# Patient Record
Sex: Male | Born: 1970 | Race: White | Hispanic: No | Marital: Married | Smoking: Current every day smoker
Health system: Southern US, Community
[De-identification: ages and names within clinical notes are randomized; demographics above are authoritative.]

## PROBLEM LIST (undated history)

## (undated) DIAGNOSIS — G894 Chronic pain syndrome: Secondary | ICD-10-CM

## (undated) DIAGNOSIS — F909 Attention-deficit hyperactivity disorder, unspecified type: Secondary | ICD-10-CM

## (undated) DIAGNOSIS — I2510 Atherosclerotic heart disease of native coronary artery without angina pectoris: Secondary | ICD-10-CM

## (undated) DIAGNOSIS — F32A Depression, unspecified: Secondary | ICD-10-CM

## (undated) DIAGNOSIS — R1115 Cyclical vomiting syndrome unrelated to migraine: Secondary | ICD-10-CM

## (undated) DIAGNOSIS — I255 Ischemic cardiomyopathy: Secondary | ICD-10-CM

## (undated) DIAGNOSIS — R0789 Other chest pain: Secondary | ICD-10-CM

## (undated) DIAGNOSIS — M47812 Spondylosis without myelopathy or radiculopathy, cervical region: Secondary | ICD-10-CM

## (undated) DIAGNOSIS — M47816 Spondylosis without myelopathy or radiculopathy, lumbar region: Secondary | ICD-10-CM

## (undated) DIAGNOSIS — F39 Unspecified mood [affective] disorder: Secondary | ICD-10-CM

## (undated) DIAGNOSIS — N529 Male erectile dysfunction, unspecified: Secondary | ICD-10-CM

## (undated) DIAGNOSIS — F419 Anxiety disorder, unspecified: Secondary | ICD-10-CM

## (undated) DIAGNOSIS — J439 Emphysema, unspecified: Secondary | ICD-10-CM

## (undated) DIAGNOSIS — F17200 Nicotine dependence, unspecified, uncomplicated: Secondary | ICD-10-CM

## (undated) DIAGNOSIS — J342 Deviated nasal septum: Secondary | ICD-10-CM

## (undated) HISTORY — DX: Spondylosis without myelopathy or radiculopathy, cervical region: M47.812

## (undated) HISTORY — PX: COLONOSCOPY: SHX174

## (undated) HISTORY — DX: Other chest pain: R07.89

## (undated) HISTORY — PX: HAND SURGERY: SHX662

## (undated) HISTORY — DX: Unspecified mood (affective) disorder: F39

## (undated) HISTORY — DX: Attention-deficit hyperactivity disorder, unspecified type: F90.9

## (undated) HISTORY — DX: Ischemic cardiomyopathy: I25.5

## (undated) HISTORY — DX: Emphysema, unspecified: J43.9

## (undated) HISTORY — DX: Deviated nasal septum: J34.2

## (undated) HISTORY — DX: Anxiety disorder, unspecified: F41.9

## (undated) HISTORY — DX: Male erectile dysfunction, unspecified: N52.9

## (undated) HISTORY — DX: Atherosclerotic heart disease of native coronary artery without angina pectoris: I25.10

## (undated) HISTORY — DX: Nicotine dependence, unspecified, uncomplicated: F17.200

## (undated) HISTORY — DX: Cyclical vomiting syndrome unrelated to migraine: R11.15

## (undated) HISTORY — PX: ESOPHAGOGASTRODUODENOSCOPY: SHX1529

## (undated) HISTORY — DX: Depression, unspecified: F32.A

## (undated) HISTORY — DX: Spondylosis without myelopathy or radiculopathy, lumbar region: M47.816

## (undated) HISTORY — DX: Chronic pain syndrome: G89.4

---

## 1998-09-29 ENCOUNTER — Emergency Department (HOSPITAL_COMMUNITY): Admission: EM | Admit: 1998-09-29 | Discharge: 1998-09-29 | Payer: Self-pay | Admitting: Internal Medicine

## 1999-01-26 ENCOUNTER — Emergency Department (HOSPITAL_COMMUNITY): Admission: EM | Admit: 1999-01-26 | Discharge: 1999-01-26 | Payer: Self-pay | Admitting: *Deleted

## 1999-02-24 ENCOUNTER — Emergency Department (HOSPITAL_COMMUNITY): Admission: EM | Admit: 1999-02-24 | Discharge: 1999-02-24 | Payer: Self-pay | Admitting: Emergency Medicine

## 1999-02-28 ENCOUNTER — Emergency Department (HOSPITAL_COMMUNITY): Admission: EM | Admit: 1999-02-28 | Discharge: 1999-02-28 | Payer: Self-pay | Admitting: Emergency Medicine

## 1999-03-03 ENCOUNTER — Emergency Department (HOSPITAL_COMMUNITY): Admission: EM | Admit: 1999-03-03 | Discharge: 1999-03-03 | Payer: Self-pay | Admitting: Internal Medicine

## 2000-02-13 ENCOUNTER — Emergency Department (HOSPITAL_COMMUNITY): Admission: EM | Admit: 2000-02-13 | Discharge: 2000-02-13 | Payer: Self-pay | Admitting: Emergency Medicine

## 2000-02-27 ENCOUNTER — Emergency Department (HOSPITAL_COMMUNITY): Admission: EM | Admit: 2000-02-27 | Discharge: 2000-02-27 | Payer: Self-pay | Admitting: Emergency Medicine

## 2000-11-20 ENCOUNTER — Emergency Department (HOSPITAL_COMMUNITY): Admission: EM | Admit: 2000-11-20 | Discharge: 2000-11-20 | Payer: Self-pay | Admitting: Emergency Medicine

## 2005-12-11 ENCOUNTER — Emergency Department (HOSPITAL_COMMUNITY): Admission: EM | Admit: 2005-12-11 | Discharge: 2005-12-11 | Payer: Self-pay | Admitting: Emergency Medicine

## 2005-12-20 ENCOUNTER — Emergency Department (HOSPITAL_COMMUNITY): Admission: EM | Admit: 2005-12-20 | Discharge: 2005-12-20 | Payer: Self-pay | Admitting: Emergency Medicine

## 2006-01-04 ENCOUNTER — Emergency Department (HOSPITAL_COMMUNITY): Admission: EM | Admit: 2006-01-04 | Discharge: 2006-01-04 | Payer: Self-pay | Admitting: Emergency Medicine

## 2006-01-05 ENCOUNTER — Emergency Department (HOSPITAL_COMMUNITY): Admission: EM | Admit: 2006-01-05 | Discharge: 2006-01-05 | Payer: Self-pay | Admitting: Emergency Medicine

## 2006-05-24 HISTORY — PX: ABDOMINAL SURGERY: SHX537

## 2007-09-26 ENCOUNTER — Emergency Department (HOSPITAL_COMMUNITY): Admission: EM | Admit: 2007-09-26 | Discharge: 2007-09-26 | Payer: Self-pay | Admitting: Emergency Medicine

## 2007-11-25 ENCOUNTER — Emergency Department (HOSPITAL_COMMUNITY): Admission: EM | Admit: 2007-11-25 | Discharge: 2007-11-25 | Payer: Self-pay | Admitting: Emergency Medicine

## 2008-01-29 ENCOUNTER — Emergency Department (HOSPITAL_BASED_OUTPATIENT_CLINIC_OR_DEPARTMENT_OTHER): Admission: EM | Admit: 2008-01-29 | Discharge: 2008-01-29 | Payer: Self-pay | Admitting: Emergency Medicine

## 2008-02-06 ENCOUNTER — Emergency Department (HOSPITAL_BASED_OUTPATIENT_CLINIC_OR_DEPARTMENT_OTHER): Admission: EM | Admit: 2008-02-06 | Discharge: 2008-02-06 | Payer: Self-pay | Admitting: Emergency Medicine

## 2008-02-09 ENCOUNTER — Emergency Department (HOSPITAL_BASED_OUTPATIENT_CLINIC_OR_DEPARTMENT_OTHER): Admission: EM | Admit: 2008-02-09 | Discharge: 2008-02-09 | Payer: Self-pay | Admitting: Emergency Medicine

## 2008-02-22 ENCOUNTER — Emergency Department (HOSPITAL_BASED_OUTPATIENT_CLINIC_OR_DEPARTMENT_OTHER): Admission: EM | Admit: 2008-02-22 | Discharge: 2008-02-22 | Payer: Self-pay | Admitting: Emergency Medicine

## 2008-10-17 ENCOUNTER — Emergency Department (HOSPITAL_COMMUNITY): Admission: EM | Admit: 2008-10-17 | Discharge: 2008-10-17 | Payer: Self-pay | Admitting: Emergency Medicine

## 2009-01-19 ENCOUNTER — Emergency Department (HOSPITAL_BASED_OUTPATIENT_CLINIC_OR_DEPARTMENT_OTHER): Admission: EM | Admit: 2009-01-19 | Discharge: 2009-01-19 | Payer: Self-pay | Admitting: Emergency Medicine

## 2009-03-17 ENCOUNTER — Emergency Department (HOSPITAL_BASED_OUTPATIENT_CLINIC_OR_DEPARTMENT_OTHER): Admission: EM | Admit: 2009-03-17 | Discharge: 2009-03-17 | Payer: Self-pay | Admitting: Emergency Medicine

## 2009-06-18 ENCOUNTER — Emergency Department (HOSPITAL_BASED_OUTPATIENT_CLINIC_OR_DEPARTMENT_OTHER): Admission: EM | Admit: 2009-06-18 | Discharge: 2009-06-18 | Payer: Self-pay | Admitting: Emergency Medicine

## 2009-08-27 ENCOUNTER — Telehealth (INDEPENDENT_AMBULATORY_CARE_PROVIDER_SITE_OTHER): Payer: Self-pay | Admitting: *Deleted

## 2009-08-27 ENCOUNTER — Encounter: Payer: Self-pay | Admitting: Family Medicine

## 2009-08-28 ENCOUNTER — Encounter: Payer: Self-pay | Admitting: Family Medicine

## 2009-09-01 ENCOUNTER — Ambulatory Visit: Payer: Self-pay | Admitting: Family Medicine

## 2009-09-01 DIAGNOSIS — F411 Generalized anxiety disorder: Secondary | ICD-10-CM | POA: Insufficient documentation

## 2009-09-01 DIAGNOSIS — F4001 Agoraphobia with panic disorder: Secondary | ICD-10-CM | POA: Insufficient documentation

## 2009-09-01 DIAGNOSIS — M5137 Other intervertebral disc degeneration, lumbosacral region: Secondary | ICD-10-CM | POA: Insufficient documentation

## 2009-09-02 ENCOUNTER — Telehealth: Payer: Self-pay | Admitting: Family Medicine

## 2010-06-23 NOTE — Progress Notes (Signed)
Summary: Reschedule apt.  Phone Note From Other Clinic   Summary of Call: Doc from [facility] stating Pt was DCd from hosp today and had to cancel apt w/ Dr. Cathey Endow. Doc requested [place] to call Pt to reschedule apt. I called Pt at # left on VM but I was unable to leave message for Pt bc mailbox was full.  Initial call taken by: Payton Spark CMA,  August 27, 2009 4:18 PM

## 2010-06-23 NOTE — Progress Notes (Signed)
Summary: Med refills?  Phone Note Call from Patient   Caller: Patient Summary of Call: Pt states his apt w/ Dr.Moore isn't until June. He would like to know if you will fill Adderall and Valium until he is able to see her. Please advise. Initial call taken by: Payton Spark CMA,  September 02, 2009 3:19 PM  Follow-up for Phone Call        no, I am not comfortable filling these RX's esp w/o old records. Follow-up by: Seymour Bars DO,  September 03, 2009 11:09 AM     Appended Document: Med refills? Pt aware

## 2010-06-23 NOTE — Assessment & Plan Note (Signed)
Summary: NOV anxiety/ back pain   Vital Signs:  Patient profile:   40 year old male Height:      67 inches Weight:      165 pounds BMI:     25.94 O2 Sat:      96 % on Room air Temp:     98.0 degrees F oral Pulse rate:   82 / minute BP sitting:   115 / 77  (left arm) Cuff size:   large  Vitals Entered By: Payton Spark CMA (40 11, 2011 10:49 AM)  O2 Flow:  Room air CC: New to est. Would like to restart Valium and Adderall. Is currently taking Percocet, promethazine and protonix for GI Sxs given by hosp.    Primary Care Provider:  Seymour Bars DO  CC:  New to est. Would like to restart Valium and Adderall. Is currently taking Percocet and promethazine and protonix for GI Sxs given by hosp. Marland Kitchen  History of Present Illness: 40 yo WM presents for NOV.  He was seen at the new [facility] last wk for gastroenteritis.  He is feeling better.  Stools are firming up and his nausea has improved.  He was sent home on Protonix and Phenergan.  He just moved back to the area from [place].  He has hx of GAD with agoraphobia, previously seen by psych.  On long term benzos and has been on Adderall for about a year for ADD dx.  He is on Percocet 3 x a day for LBP with hx of DDD.  Was seen by pain clinic in the past and did PT and LESIs but 'nothing else worked'.  He is not asking for RFs today.    Current Medications (verified): 1)  Valium 10 Mg Tabs (Diazepam) .... Take 1 Tab By Mouth Three Times A Day  As Needed Anxiety 2)  Adderall 20 Mg Tabs (Amphetamine-Dextroamphetamine) .... Take 1 Tab By Mouth Two Times A Day  Allergies (verified): 1)  ! Pcn 2)  ! Morphine 3)  ! Darvocet 4)  ! Keflex  Past History:  Past Medical History: GAD with agoraphobia ADD (?) Lumbar DDD  Past Surgical History: R hand surgery  Family History: father healthy sister anxiety mother died of breast cancer at 93  Social History: Disabled due to anxiety.  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  Kids live w/ their moms. Lives w/ fiance.  Smokes 1 ppd x 20 yrs. No regular exercise. Denies ellicit drug use  Review of Systems       no fevers/sweats/weakness, unexplained wt loss/gain, no change in vision, no difficulty hearing, ringing in ears, no hay fever/allergies, no CP/discomfort, no palpitations, no breast lump/nipple discharge, no cough/wheeze, no blood in stool,no  N/V/D, no nocturia, no leaking urine, no unusual vag bleeding, no vaginal/penile discharge, no muscle/joint pain, no rash, no new/changing mole, no HA, no memory loss, + anxiety, + sleep problem, no depression, no unexplained lumps, no easy bruising/bleeding, +concern with sexual function (trouble maintaining erection)   Physical Exam  General:  alert, well-developed, well-nourished, and well-hydrated.   Head:  normocephalic and atraumatic.   Eyes:  sclera non icteric Mouth:  pharynx pink and moist and poor dentition.   Neck:  no masses.   Lungs:  Normal respiratory effort, chest expands symmetrically. Lungs are clear to auscultation, no crackles or wheezes. Heart:  Normal rate and regular rhythm. S1 and S2 normal without gallop, murmur, click, rub or other extra  sounds. Extremities:  no LE edema R wrist in cast Skin:  color normal.  multiple tattoes dirty fingernails Psych:  flat affect.     Impression & Recommendations:  Problem # 1:  ANXIETY DISORDER (ICD-300.00) Will refer him to Dr Christell Constant to manage his anxiety, agoraphobia and ADD.  Heavy benzo use by history and he is not on an SSRI or mood stabilizer.  His updated medication list for this problem includes:    Valium 10 Mg Tabs (Diazepam) .Marland Kitchen... 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  No new RXs given today. Orders: Pain  Clinic Referral (Pain)  Problem # 3:  GASTROENTERITIS (ICD-558.9) Assessment: Improved  Complete Medication List: 1)  Valium 10 Mg Tabs (Diazepam) .... Take 1 tab by mouth three times a day  as needed anxiety 2)  Adderall 20 Mg Tabs (Amphetamine-dextroamphetamine) .... Take 1 tab by mouth two times a day  Patient Instructions: 1)  Referrals made for the pain clinic and psychiatrist. 2)  Use Cialis samples as needed. 3)  Return in 3 months to update fasting labs.

## 2010-06-23 NOTE — Medication Information (Signed)
Summary: Med Refill List/[facility]  Med Refill List/[facility]   Imported By: Lanelle Bal 09/06/2009 11:47:57  _____________________________________________________________________  External Attachment:    Type:   Image     Comment:   External Document

## 2010-06-23 NOTE — Letter (Signed)
Summary: [facility]  [facility]   Imported By: Lanelle Bal 09/09/2009 12:16:24  _____________________________________________________________________  External Attachment:    Type:   Image     Comment:   External Document

## 2011-11-18 ENCOUNTER — Ambulatory Visit (INDEPENDENT_AMBULATORY_CARE_PROVIDER_SITE_OTHER)
Admission: RE | Admit: 2011-11-18 | Discharge: 2011-11-18 | Disposition: A | Discharge status: Home / Self Care | Payer: Medicare Other | Source: Ambulatory Visit | Attending: Family Medicine | Admitting: Family Medicine

## 2011-11-18 ENCOUNTER — Ambulatory Visit (INDEPENDENT_AMBULATORY_CARE_PROVIDER_SITE_OTHER): Payer: Medicare Other | Admitting: Family Medicine

## 2011-11-18 ENCOUNTER — Other Ambulatory Visit: Payer: Self-pay

## 2011-11-18 ENCOUNTER — Other Ambulatory Visit: Payer: Self-pay | Admitting: Family Medicine

## 2011-11-18 ENCOUNTER — Encounter: Payer: Self-pay | Admitting: Family Medicine

## 2011-11-18 VITALS — BP 131/90 | HR 63 | Temp 96.7°F | Ht 67.0 in | Wt 180.0 lb

## 2011-11-18 DIAGNOSIS — K219 Gastro-esophageal reflux disease without esophagitis: Secondary | ICD-10-CM | POA: Insufficient documentation

## 2011-11-18 DIAGNOSIS — R7402 Elevation of levels of lactic acid dehydrogenase (LDH): Secondary | ICD-10-CM

## 2011-11-18 DIAGNOSIS — Z609 Problem related to social environment, unspecified: Secondary | ICD-10-CM | POA: Insufficient documentation

## 2011-11-18 DIAGNOSIS — R079 Chest pain, unspecified: Secondary | ICD-10-CM

## 2011-11-18 DIAGNOSIS — Z7289 Other problems related to lifestyle: Secondary | ICD-10-CM

## 2011-11-18 DIAGNOSIS — Z7251 High risk heterosexual behavior: Secondary | ICD-10-CM

## 2011-11-18 DIAGNOSIS — IMO0001 Reserved for inherently not codable concepts without codable children: Secondary | ICD-10-CM

## 2011-11-18 DIAGNOSIS — R7401 Elevation of levels of liver transaminase levels: Secondary | ICD-10-CM

## 2011-11-18 LAB — CBC WITH DIFFERENTIAL/PLATELET
Basophils Relative: 0.4 % (ref 0.0–3.0)
Eosinophils Absolute: 0.1 10*3/uL (ref 0.0–0.7)
HCT: 44.1 % (ref 39.0–52.0)
Lymphs Abs: 2.3 10*3/uL (ref 0.7–4.0)
MCHC: 33.2 g/dL (ref 30.0–36.0)
Neutro Abs: 4.8 10*3/uL (ref 1.4–7.7)
Platelets: 267 10*3/uL (ref 150.0–400.0)
RBC: 4.7 Mil/uL (ref 4.22–5.81)
RDW: 13.8 % (ref 11.5–14.6)

## 2011-11-18 LAB — COMPREHENSIVE METABOLIC PANEL
ALT: 18 U/L (ref 0–53)
AST: 17 U/L (ref 0–37)
Albumin: 4 g/dL (ref 3.5–5.2)
Alkaline Phosphatase: 80 U/L (ref 39–117)
BUN: 10 mg/dL (ref 6–23)
CO2: 31 mEq/L (ref 19–32)
Calcium: 9.3 mg/dL (ref 8.4–10.5)
Chloride: 105 mEq/L (ref 96–112)
Potassium: 4.2 mEq/L (ref 3.5–5.1)
Total Bilirubin: 0.4 mg/dL (ref 0.3–1.2)

## 2011-11-18 LAB — D-DIMER, QUANTITATIVE: D-Dimer, Quant: 0.35 ug/mL-FEU (ref 0.00–0.48)
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
## 2011-11-18 NOTE — Assessment & Plan Note (Signed)
History of incarceration, question of pain med seeking in the past, failure to comply with recommended referrals to pain mgmt and psychiatry. Will have to take things slowly, work on one problem at a time, see that he is going to be compliant. I did not start and psych rx's or pain meds today--we did not even get into a discussion about management of these problems yet. I will f/u his reported hx of elevated liver enzymes while in prison (he says all hepatitis testing was neg): check CMET, Hep B sAg, Hep B surface antibody, anti-hep C antibody.

## 2011-11-18 NOTE — Patient Instructions (Signed)
Take one Dexilant 60mg  cap once every morning before breakfast.  Diet for GERD or PUD Nutrition therapy can help ease the discomfort of gastroesophageal reflux disease (GERD) and peptic ulcer disease (PUD).  HOME CARE INSTRUCTIONS   Eat your meals slowly, in a relaxed setting.   Eat 5 to 6 small meals per day.   If a food causes distress, stop eating it for a period of time.  FOODS TO AVOID  Coffee, regular or decaffeinated.   Cola beverages, regular or low calorie.   Tea, regular or decaffeinated.   Pepper.   Cocoa.   High fat foods, including meats.   Butter, margarine, hydrogenated oil (trans fats).   Peppermint or spearmint (if you have GERD).   Fruits and vegetables if not tolerated.   Alcohol.   Nicotine (smoking or chewing). This is one of the most potent stimulants to acid production in the gastrointestinal tract.   Any food that seems to aggravate your condition.  If you have questions regarding your diet, ask your caregiver or a registered dietitian. TIPS  Lying flat may make symptoms worse. Keep the head of your bed raised 6 to 9 inches (15 to 23 cm) by using a foam wedge or blocks under the legs of the bed.   Do not lay down until 3 hours after eating a meal.   Daily physical activity may help reduce symptoms.  MAKE SURE YOU:   Understand these instructions.   Will watch your condition.   Will get help right away if you are not doing well or get worse.  Document Released: 05/10/2005 Document Revised: 04/29/2011 Document Reviewed: 03/26/2011 [facility] Patient Information 2012 [place], [place].

## 2011-11-18 NOTE — Assessment & Plan Note (Addendum)
Atypical chest pain, not suggestive of cardiac ischemia. Will check d-dimer and if pos will proceed with chest CT to r/o PE.  Will check CXR here today. When he returns in 2 wks we'll do TB skin test. Will treat for possible GERD/Esophagitis: samples of dexilant 60mg  qd given.  Therapeutic expectations and side effect profile of medication discussed today.  Patient's questions answered. This chest pain could also be musculoskeletal given it's reproduceability with left chest wall palpation.

## 2011-11-23 ENCOUNTER — Other Ambulatory Visit: Payer: Medicare Other

## 2011-12-02 ENCOUNTER — Encounter: Payer: Self-pay | Admitting: Family Medicine

## 2011-12-02 ENCOUNTER — Ambulatory Visit (INDEPENDENT_AMBULATORY_CARE_PROVIDER_SITE_OTHER): Payer: Medicare Other | Admitting: Family Medicine

## 2011-12-02 VITALS — BP 138/85 | HR 68 | Ht 67.0 in | Wt 182.0 lb

## 2011-12-02 DIAGNOSIS — M51379 Other intervertebral disc degeneration, lumbosacral region without mention of lumbar back pain or lower extremity pain: Secondary | ICD-10-CM | POA: Insufficient documentation

## 2011-12-02 DIAGNOSIS — M5137 Other intervertebral disc degeneration, lumbosacral region: Secondary | ICD-10-CM | POA: Insufficient documentation

## 2011-12-02 DIAGNOSIS — Z23 Encounter for immunization: Secondary | ICD-10-CM

## 2011-12-02 DIAGNOSIS — K219 Gastro-esophageal reflux disease without esophagitis: Secondary | ICD-10-CM

## 2011-12-02 DIAGNOSIS — M5030 Other cervical disc degeneration, unspecified cervical region: Secondary | ICD-10-CM | POA: Insufficient documentation

## 2011-12-02 MED ORDER — OXYCODONE-ACETAMINOPHEN 5-325 MG PO TABS: ORAL_TABLET | ORAL | Status: DC

## 2011-12-02 NOTE — Assessment & Plan Note (Signed)
No radiculopathy. It has been 4 years since radiographic f/u per his report so I'll do plain film of L/S spine and will likely refer to neurosurgeon for further evaluation. I did give #120 Percocet 5/325 to take 1-2 q6h prn pan, no RF.  I reiterated that if this ends up being a chronic pain med treatment issue that I will refer him to a pain mgmt clinic.

## 2011-12-02 NOTE — Assessment & Plan Note (Signed)
No radiculopathy. Will do plain c-spine films today. See problem "DDD, lumbosacral" for further info.
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
## 2011-12-02 NOTE — Assessment & Plan Note (Signed)
Atypical CP had been his presentation. This is resolved on daily PPI (dexilant samples).  I recommended he take this daily for at least 56mo, then take on prn basis.

## 2011-12-21 ENCOUNTER — Telehealth: Payer: Self-pay | Admitting: *Deleted

## 2011-12-21 NOTE — Telephone Encounter (Signed)
VM left by patient requesting call back.  Attempt to call patient-automated message from phone company stating customer not available.  I will try again later.

## 2011-12-22 NOTE — Telephone Encounter (Signed)
Attempt to call patient-automated message from phone company stating customer not available. I will try again later.

## 2011-12-24 NOTE — Telephone Encounter (Signed)
I spoke to pt and he states he thinks his phone was turned off.  Pt was having "bad anxiety" on Wednesday and wanted to know if anyhting could be called in.  Advised if we have not talked to him about this at a prior visit then he would have to be seen first.  Pt has appt on Friday.  He says he can wait until then.  Sooner appt was offered, but patient declined.  He will have xrays prior to appt on Friday.

## 2011-12-28 ENCOUNTER — Ambulatory Visit (HOSPITAL_BASED_OUTPATIENT_CLINIC_OR_DEPARTMENT_OTHER)
Admission: RE | Admit: 2011-12-28 | Discharge: 2011-12-28 | Disposition: A | Discharge status: Home / Self Care | Payer: Medicare Other | Source: Ambulatory Visit | Attending: Family Medicine | Admitting: Family Medicine

## 2011-12-28 DIAGNOSIS — M542 Cervicalgia: Secondary | ICD-10-CM | POA: Insufficient documentation

## 2011-12-28 DIAGNOSIS — M545 Low back pain, unspecified: Secondary | ICD-10-CM | POA: Insufficient documentation

## 2011-12-28 DIAGNOSIS — M5030 Other cervical disc degeneration, unspecified cervical region: Secondary | ICD-10-CM | POA: Insufficient documentation

## 2011-12-28 DIAGNOSIS — M5137 Other intervertebral disc degeneration, lumbosacral region: Secondary | ICD-10-CM

## 2011-12-28 DIAGNOSIS — M47812 Spondylosis without myelopathy or radiculopathy, cervical region: Secondary | ICD-10-CM | POA: Insufficient documentation

## 2011-12-30 ENCOUNTER — Encounter: Payer: Self-pay | Admitting: Family Medicine

## 2011-12-31 ENCOUNTER — Encounter: Payer: Self-pay | Admitting: Family Medicine

## 2011-12-31 ENCOUNTER — Ambulatory Visit (INDEPENDENT_AMBULATORY_CARE_PROVIDER_SITE_OTHER): Payer: Medicare Other | Admitting: Family Medicine

## 2011-12-31 VITALS — BP 157/92 | HR 79 | Temp 97.2°F | Ht 67.0 in | Wt 173.4 lb

## 2011-12-31 DIAGNOSIS — M545 Low back pain, unspecified: Secondary | ICD-10-CM

## 2011-12-31 DIAGNOSIS — F411 Generalized anxiety disorder: Secondary | ICD-10-CM

## 2011-12-31 DIAGNOSIS — M47812 Spondylosis without myelopathy or radiculopathy, cervical region: Secondary | ICD-10-CM

## 2011-12-31 DIAGNOSIS — Z7289 Other problems related to lifestyle: Secondary | ICD-10-CM

## 2011-12-31 DIAGNOSIS — Z609 Problem related to social environment, unspecified: Secondary | ICD-10-CM

## 2011-12-31 DIAGNOSIS — IMO0001 Reserved for inherently not codable concepts without codable children: Secondary | ICD-10-CM

## 2011-12-31 DIAGNOSIS — G894 Chronic pain syndrome: Secondary | ICD-10-CM

## 2011-12-31 DIAGNOSIS — G8929 Other chronic pain: Secondary | ICD-10-CM

## 2011-12-31 DIAGNOSIS — K219 Gastro-esophageal reflux disease without esophagitis: Secondary | ICD-10-CM

## 2011-12-31 MED ORDER — PANTOPRAZOLE SODIUM 40 MG PO TBEC: 40.0000 mg | DELAYED_RELEASE_TABLET | Freq: Every day | ORAL | Status: DC

## 2011-12-31 MED ORDER — OXYCODONE-ACETAMINOPHEN 5-325 MG PO TABS: ORAL_TABLET | ORAL | Status: DC

## 2011-12-31 NOTE — Progress Notes (Signed)
OFFICE NOTE  12/31/2011  CC:  Chief Complaint  Patient presents with  . Follow-up     HPI: Patient is a 41 y.o. Caucasian male who is here for 1 month f/u chronic neck and low back pain without radicular symptoms. His L-spine film was unremarkable and his C-spine x-ray was significant for significant DDD/spondylosis. Has been using the pain meds and has a few left from the 120 I gave him a month ago.  These help him to be functional, get out of house, etc. His pain remains localized to lower lumbar region and cervical spine region but no radiation of pain, no paresthesias, no weakness.   Dexilant has helped GERD, he is running out of samples.  Pertinent PMH:  Past Medical History  Diagnosis Date  . Anxiety     Panic with agoraphobia  . Cervical spondylosis     Plain film 11/2011  . Chronic pain syndrome     low  back and neck  . Mood disorder     bipolar vs depression  . ADHD (attention deficit hyperactivity disorder)   . Tobacco dependence   . Deviated nasal septum     s/p nasal fracture  . Atypical chest pain     CXR normal here 10/2011, D-dimer negative.  Resolved with daily use of dexilant.   Past surgical, social, and family history reviewed and no changes noted since last office visit.  MEDS:  Outpatient Prescriptions Prior to Visit  Medication Sig Dispense Refill  . dexlansoprazole (DEXILANT) 60 MG capsule Take 60 mg by mouth daily.      Marland Kitchen oxyCODONE-acetaminophen (ROXICET) 5-325 MG per tablet 1-2 tabs po q6h prn  120 tablet  0    PE: Blood pressure 157/92, pulse 79, temperature 97.2 F (36.2 C), temperature source Temporal, height 5\' 7"  (1.702 m), weight 173 lb 6.4 oz (78.654 kg), SpO2 97.00%. Gen: Alert, well appearing.  Patient is oriented to person, place, time, and situation. AFFECT: pleasant, calm.  Lucid thought and speech.  IMPRESSION AND PLAN:  ANXIETY DISORDER I will refer him to interventional pain mgmt. I did RF his current pain med (percocet,  #120) today.   GERD (gastroesophageal reflux disease) Problem stable.  Continue current medications and diet appropriate for this condition.  We have reviewed our general long term plan for this problem and also reviewed symptoms and signs that should prompt the patient to call or return to the office. I gave more samples of dexilant today. Also sent rx for pantoprazole to pharmacy for him to start when his dexilant samples run out.  High risk social situations Hx of incarceration.  Multiple tattoos, will likely get more in future. TB skin test done today. When he follows up to get the skin test read, we'll give Hep B #2.   FOLLOW UP: 69mo

## 2011-12-31 NOTE — Assessment & Plan Note (Signed)
Problem stable.  Continue current medications and diet appropriate for this condition.  We have reviewed our general long term plan for this problem and also reviewed symptoms and signs that should prompt the patient to call or return to the office. I gave more samples of dexilant today. Also sent rx for pantoprazole to pharmacy for him to start when his dexilant samples run out.

## 2011-12-31 NOTE — Assessment & Plan Note (Signed)
Hx of incarceration.  Multiple tattoos, will likely get more in future. TB skin test done today. When he follows up to get the skin test read, we'll give Hep B #2.

## 2011-12-31 NOTE — Assessment & Plan Note (Signed)
I will refer him to interventional pain mgmt. I did RF his current pain med (percocet, #120) today.

## 2012-01-03 ENCOUNTER — Other Ambulatory Visit: Payer: Self-pay | Admitting: *Deleted

## 2012-01-03 ENCOUNTER — Ambulatory Visit: Payer: Medicare Other

## 2012-01-03 DIAGNOSIS — Z23 Encounter for immunization: Secondary | ICD-10-CM

## 2012-01-03 LAB — TB SKIN TEST: TB Skin Test: NEGATIVE

## 2012-01-03 NOTE — Addendum Note (Signed)
Addended byDerry Skill on: 01/03/2012 04:21 PM   Modules accepted: Orders

## 2012-02-02 ENCOUNTER — Ambulatory Visit (INDEPENDENT_AMBULATORY_CARE_PROVIDER_SITE_OTHER): Payer: Medicare Other | Admitting: Family Medicine

## 2012-02-02 ENCOUNTER — Encounter: Payer: Self-pay | Admitting: Family Medicine

## 2012-02-02 VITALS — BP 151/81 | HR 78 | Ht 67.0 in | Wt 171.0 lb

## 2012-02-02 DIAGNOSIS — G894 Chronic pain syndrome: Secondary | ICD-10-CM

## 2012-02-02 DIAGNOSIS — N529 Male erectile dysfunction, unspecified: Secondary | ICD-10-CM

## 2012-02-02 MED ORDER — OXYCODONE-ACETAMINOPHEN 5-325 MG PO TABS: ORAL_TABLET | ORAL | Status: DC

## 2012-02-02 NOTE — Assessment & Plan Note (Signed)
Longstanding (going on prior to chronic narcotic pain med use).  Libido is intact.   Pt says cialis prn has helped in the past.   We have samples of cialis 5mg  for daily use, #1 pack of 30.  I gave him these today.

## 2012-02-02 NOTE — Addendum Note (Signed)
Addended by: Baldemar Lenis R on: 02/02/2012 01:45 PM   Modules accepted: Orders

## 2012-02-02 NOTE — Assessment & Plan Note (Signed)
Neck and LB DDD. Plan discussed.  See HPI. I gave #60 of the roxicet 5/325 for him to use as sparingly as possible. UDS today.  He says his last dose of roxicet was this morning. Since he is moving, we can RF pain meds like this q 2 wks as long as he is not showing signs of overuse or diversion.

## 2012-02-02 NOTE — Progress Notes (Signed)
OFFICE NOTE  02/02/2012  CC:  Chief Complaint  Patient presents with  . Follow-up    medication refills;      HPI: Patient is a 41 y.o. Caucasian male who is here for f/u chronic pain.  I saw him 1 mo ago and referred him to pain mgmt clinic but he did not show up for that appointment.  He states he is moving back to [place] 02/2012 (towards the end of the month).  He asks for RF of pain meds. Says he missed the pain mgmt appt due to lack of transportation.  Did not cancel the appt due to some sort of phone number mix-up (unclear).    He c/o ED for many years, even prior to being on narcotic pain meds.  Libido is intact. Cialis prn dosing has helped in the past.  He says viagra worked but caused bothersome flushing.   Pertinent PMH:  Past Medical History  Diagnosis Date  . Anxiety     Panic with agoraphobia  . Cervical spondylosis     Plain film 11/2011  . Chronic pain syndrome     low  back and neck  . Mood disorder     bipolar vs depression  . ADHD (attention deficit hyperactivity disorder)   . Tobacco dependence   . Deviated nasal septum     s/p nasal fracture  . Atypical chest pain     CXR normal here 10/2011, D-dimer negative.  Resolved with daily use of dexilant.    MEDS:  Outpatient Prescriptions Prior to Visit  Medication Sig Dispense Refill  . dexlansoprazole (DEXILANT) 60 MG capsule Take 60 mg by mouth daily.      Marland Kitchen oxyCODONE-acetaminophen (ROXICET) 5-325 MG per tablet 1-2 tabs po q6h prn  120 tablet  0  . pantoprazole (PROTONIX) 40 MG tablet Take 1 tablet (40 mg total) by mouth daily.  30 tablet  3    PE: Blood pressure 151/81, pulse 78, height 5\' 7"  (1.702 m), weight 171 lb (77.565 kg). Gen: Alert, well appearing.  Patient is oriented to person, place, time, and situation. AFFECT: pleasant, lucid thought and speech. No further exam today.  IMPRESSION AND PLAN:  Chronic pain syndrome Neck and LB DDD. Plan discussed.  See HPI. I gave #60 of the roxicet  5/325 for him to use as sparingly as possible. UDS today.  He says his last dose of roxicet was this morning. Since he is moving, we can RF pain meds like this q 2 wks as long as he is not showing signs of overuse or diversion.  Erectile dysfunction Longstanding (going on prior to chronic narcotic pain med use).  Libido is intact.   Pt says cialis prn has helped in the past.   We have samples of cialis 5mg  for daily use, #1 pack of 30.  I gave him these today.   He declined flu vaccine today.  FOLLOW UP: prn

## 2012-02-03 LAB — DRUG SCREEN, URINE
Amphetamine Screen, Ur: NEGATIVE
Cocaine Metabolites: NEGATIVE
Creatinine,U: 158.98 mg/dL
Marijuana Metabolite: NEGATIVE
Opiates: NEGATIVE

## 2012-02-07 NOTE — Progress Notes (Signed)
Quick Note:  Notified pt on phone today at 8:15 AM that his UDS showed NO NARCOTICS. He said he took narcotic pain med just a few hours prior to this UDS. I told him that I will no longer prescribe any controlled substances to him. He expressed understanding of this decision.-PM ______

## 2012-02-10 ENCOUNTER — Telehealth: Payer: Self-pay | Admitting: Family Medicine

## 2012-02-10 NOTE — Telephone Encounter (Signed)
LMOM today at 6 pm asking pt to return my call.  I want to discuss the fact that his recent UDS was indeed negative for opiates (morphine and codeine) but should have also tested for opioids (hydrocodone, oxycodone, hydromorphone).   I will offer redo of the appropriate UDS to confirm that he is taking his pain meds IF he has not already run out of the most recent rx I gave him.

## 2012-02-10 NOTE — Telephone Encounter (Signed)
Pt called back and i notified him that the incorrect UDS was done last time he was in office. I recommended he return for the appropriate UDS (to check for opioids) BEFORE he runs out of current pain med.  He said he would do this.

## 2012-02-18 ENCOUNTER — Ambulatory Visit (INDEPENDENT_AMBULATORY_CARE_PROVIDER_SITE_OTHER): Payer: Medicare Other | Admitting: Family Medicine

## 2012-02-18 ENCOUNTER — Encounter: Payer: Self-pay | Admitting: Family Medicine

## 2012-02-18 ENCOUNTER — Other Ambulatory Visit: Payer: Self-pay | Admitting: Family Medicine

## 2012-02-18 VITALS — BP 157/82 | HR 74 | Ht 67.0 in | Wt 172.0 lb

## 2012-02-18 DIAGNOSIS — G894 Chronic pain syndrome: Secondary | ICD-10-CM

## 2012-02-18 DIAGNOSIS — M545 Low back pain, unspecified: Secondary | ICD-10-CM

## 2012-02-18 DIAGNOSIS — G8929 Other chronic pain: Secondary | ICD-10-CM

## 2012-02-18 MED ORDER — OXYCODONE-ACETAMINOPHEN 5-325 MG PO TABS: ORAL_TABLET | ORAL | Status: DC

## 2012-02-18 NOTE — Progress Notes (Signed)
OFFICE NOTE  02/20/2012  CC:  Chief Complaint  Patient presents with  . Follow-up    pain     HPI: Patient is a 41 y.o. Caucasian male who is here for f/u chronic pain syndrome.  Hx of chronic neck and low back pain.  Intensity of low back pain 8/10 off meds, comes down to 4/10 with oxycodone/apap.  He says he averages 4 per day but some days lately he has taken more due to tooth for a broken tooth; has appt to get this out next week.  Took his last 2 pain pills yesterday.  Neck pain is less of a problem--occ some radiation around side of neck to top of shoulder. He has no radiation of his low back pain.  He says he has had this severe back pain for at least 10 yrs.  Last visit I did a UDS on him and it came back negative for opiates.  The screen did not test for OPIOIDS, which is what oxycodone would show up as.  I initially had told him he failed his UDS and that I could not prescribe any controlled substances to him b/c it appears he may be diverting the medication.  However, I called him back and told him I did not do the correct UDS and told him to come back and get the correct UDS.  He does say he had a UDS by his parole officer and it did show opioids so he was able to get his leg monitor off--this test was around the same time as the one I did recently.    Pertinent PMH:  Past Medical History  Diagnosis Date  . Anxiety     Panic with agoraphobia  . Cervical spondylosis     Plain film 11/2011  . Chronic pain syndrome     low  back and neck  . Mood disorder     bipolar vs depression  . ADHD (attention deficit hyperactivity disorder)   . Tobacco dependence   . Deviated nasal septum     s/p nasal fracture  . Atypical chest pain     CXR normal here 10/2011, D-dimer negative.  Resolved with daily use of dexilant.    MEDS:  Outpatient Prescriptions Prior to Visit  Medication Sig Dispense Refill  . dexlansoprazole (DEXILANT) 60 MG capsule Take 60 mg by mouth daily.      Marland Kitchen  oxyCODONE-acetaminophen (ROXICET) 5-325 MG per tablet 1-2 tabs po q6h prn  60 tablet  0    PE: Blood pressure 157/82, pulse 74, height 5\' 7"  (1.702 m), weight 172 lb (78.019 kg). Gen: Alert, well appearing.  Patient is oriented to person, place, time, and situation. AFFECT: pleasant, lucid thought and speech. Forward flexion painful and he can get to 90 degrees.  Other ROM intact w/out pain. He has tenderness in Left paraspinous region and over spinous processes at the level of distal T spine and proximal L spine.  No lower lumbar or sacral spine tenderness.  Sitting SLR neg bilat.  LE strength 5/5 prox and dist bilat.  DTRs intact/symmetric in both LE's.   LAB: none today    IMPRESSION AND PLAN:  Chronic pain syndrome Neck pain unremarkable at this time. His main region of pain is distal T spine/proximal L spine, centrally and in left paraspinous soft tissue region.   His plain films of C-spine and L-spine have been pretty unremarkable.   I have discussed with him that I have apprehension about treating his  pain with chronic narcotics given no objective finding of significant spinal pathology.  He expressed understanding and stated simply that he has had this severe pain for many years and has been on narcotics the whole time.  Nevertheless, I think it is best to pursue more detailed low back imaging with MRI spine w/out contrast to see if significant spinal pathology exists that would explain his chronic pain.  .   I did get a UDS on him today.  I gave #60 of the roxicet 5/500, 1-2 tabs po q6h prn pain, no RF.   We discussed ongoing pain mgmt and he requested another referral to a pain clinic (he no showed for the first referral we made for him due to transportation problems.  He says he has a girlfriend now that has a car and can give him a ride.  He requested a referral [place] a [place] mgmt clinic [place].  I ordered this today.   Teeth pain/dental disrepair: he says he has dentist appt  next week to get the most painful tooth pulled.  FOLLOW UP: 2 wks

## 2012-02-18 NOTE — Patient Instructions (Addendum)
Please give pt the address to [facility].--thx

## 2012-02-19 LAB — PRESCRIPTION ABUSE MONITORING 10P, URINE
Amphetamine/Meth: NEGATIVE ng/mL
Benzodiazepine Screen, Urine: NEGATIVE ng/mL
Cannabinoid Scrn, Ur: NEGATIVE ng/mL
Cocaine Metabolites: NEGATIVE ng/mL
Creatinine, Urine: 144.57 mg/dL (ref >20.0)
Oxycodone Screen, Ur: NEGATIVE ng/mL

## 2012-02-20 NOTE — Assessment & Plan Note (Signed)
Neck pain unremarkable at this time. His main region of pain is distal T spine/proximal L spine, centrally and in left paraspinous soft tissue region.   His plain films of C-spine and L-spine have been pretty unremarkable.   I have discussed with him that I have apprehension about treating his pain with chronic narcotics given no objective finding of significant spinal pathology.  He expressed understanding and stated simply that he has had this severe pain for many years and has been on narcotics the whole time.  Nevertheless, I think it is best to pursue more detailed low back imaging with MRI spine w/out contrast to see if significant spinal pathology exists that would explain his chronic pain.  .   I did get a UDS on him today.  I gave #60 of the roxicet 5/500, 1-2 tabs po q6h prn pain, no RF.   We discussed ongoing pain mgmt and he requested another referral to a pain clinic (he no showed for the first referral we made for him due to transportation problems.  He says he has a girlfriend now that has a car and can give him a ride.  He requested a referral to a pain mgmt clinic [place].  I ordered this today.

## 2012-02-22 LAB — OPIATES/OPIOIDS (LC/MS-MS)
Codeine Urine: NEGATIVE ng/mL
Hydrocodone: 207 ng/mL
Norhydrocodone, Ur: 359 ng/mL
Oxymorphone: NEGATIVE ng/mL

## 2012-02-26 ENCOUNTER — Inpatient Hospital Stay: Admission: RE | Admit: 2012-02-26 | Payer: Medicare Other | Source: Ambulatory Visit

## 2012-02-29 ENCOUNTER — Ambulatory Visit (HOSPITAL_BASED_OUTPATIENT_CLINIC_OR_DEPARTMENT_OTHER): Payer: Medicare Other

## 2012-03-03 ENCOUNTER — Telehealth: Payer: Self-pay | Admitting: Family Medicine

## 2012-03-03 MED ORDER — OXYCODONE-ACETAMINOPHEN 5-325 MG PO TABS: ORAL_TABLET | ORAL | Status: DC

## 2012-03-03 NOTE — Telephone Encounter (Signed)
Kristi informed pt that RX is ready to be picked up

## 2012-03-03 NOTE — Telephone Encounter (Signed)
OK to refill Percocet 5/325 1 tab po q 6 hr prn pain, #60, no rf

## 2012-03-04 ENCOUNTER — Ambulatory Visit (HOSPITAL_BASED_OUTPATIENT_CLINIC_OR_DEPARTMENT_OTHER): Payer: Medicare Other

## 2012-03-07 ENCOUNTER — Ambulatory Visit (HOSPITAL_BASED_OUTPATIENT_CLINIC_OR_DEPARTMENT_OTHER): Payer: Medicare Other

## 2012-03-11 ENCOUNTER — Ambulatory Visit (HOSPITAL_BASED_OUTPATIENT_CLINIC_OR_DEPARTMENT_OTHER): Payer: Medicare Other

## 2012-03-21 ENCOUNTER — Ambulatory Visit (HOSPITAL_BASED_OUTPATIENT_CLINIC_OR_DEPARTMENT_OTHER): Payer: Medicare Other

## 2012-05-10 ENCOUNTER — Telehealth: Payer: Self-pay | Admitting: Emergency Medicine

## 2012-05-10 NOTE — Telephone Encounter (Signed)
RC to patient.  Wanted to know if we would still see him although we had some trouble with his latest urine drug screen.  Advised we would still be his provider, but we would not prescribe any controlled substances.  Pt wants treatment for bipolar disorder.  Appt is made for Friday at 1:30pm.

## 2012-05-10 NOTE — Telephone Encounter (Signed)
Patient stated he wanted the doctor to call him. I explained that he was seeing patients at this time and the message would be forwarded to the nurse, patient states this is important.

## 2012-05-12 ENCOUNTER — Ambulatory Visit: Payer: Medicare Other | Admitting: Family Medicine

## 2012-05-12 DIAGNOSIS — Z0289 Encounter for other administrative examinations: Secondary | ICD-10-CM

## 2012-06-05 ENCOUNTER — Ambulatory Visit (INDEPENDENT_AMBULATORY_CARE_PROVIDER_SITE_OTHER): Payer: Medicare Other | Admitting: Family Medicine

## 2012-06-05 ENCOUNTER — Encounter: Payer: Self-pay | Admitting: Family Medicine

## 2012-06-05 VITALS — BP 136/82 | HR 71 | Temp 98.9°F | Ht 67.0 in | Wt 173.0 lb

## 2012-06-05 DIAGNOSIS — N529 Male erectile dysfunction, unspecified: Secondary | ICD-10-CM

## 2012-06-05 DIAGNOSIS — K219 Gastro-esophageal reflux disease without esophagitis: Secondary | ICD-10-CM

## 2012-06-05 MED ORDER — DEXLANSOPRAZOLE 60 MG PO CPDR: 60.0000 mg | DELAYED_RELEASE_CAPSULE | Freq: Every day | ORAL | Status: DC

## 2012-06-05 MED ORDER — TADALAFIL 5 MG PO TABS: 5.0000 mg | ORAL_TABLET | Freq: Every day | ORAL | Status: DC | PRN

## 2012-06-05 NOTE — Assessment & Plan Note (Signed)
Pt desired rx for cialis 5mg  qd so I did this today.

## 2012-06-05 NOTE — Assessment & Plan Note (Signed)
Discussed dx, appropriate dietary and lifestyle mod that can help (educational handout was given to pt today), gave 2 bottles of samples of dexilant 60mg  qd and gave rx for this med to take 60mg  qd.

## 2012-06-05 NOTE — Progress Notes (Signed)
OFFICE NOTE  06/05/2012  CC:  Chief Complaint  Patient presents with  . Gastrophageal Reflux    not daily; after meals  . Panic Attack    hasn't slept in 2 days; hx agoraphobia; feels like a lot he has to do and get done; making him anxious     HPI: Patient is a 42 y.o. Caucasian male who is here for acid reflux.   Lots of heartburn, belching, bothers him after supper mostly b/c meals in morning and mid day are usually better foods.   NO cough or ST or dysphagia. Has tried pepto, OTC H2 blockers, and "all that stuff"--he could not be more specific. He has responded well to dexilant that we have given him here, but says his sx's return within a week of stopping the med.  He has been out of the med and asks for more samples or rx.  He also asks for samples of 5mg  daily dose of cialis.  Wants rx if we don't have samples.  He then started to bring up his problems lately with insomnia and feeling anxious and irritable but we decided that this discussion/evaluation would best be done during its own appointment.  Pertinent PMH:  Past Medical History  Diagnosis Date  . Anxiety     Panic with agoraphobia  . Cervical spondylosis     Plain film 11/2011  . Chronic pain syndrome     low  back and neck  . Mood disorder     bipolar vs depression  . ADHD (attention deficit hyperactivity disorder)   . Tobacco dependence   . Deviated nasal septum     s/p nasal fracture  . Atypical chest pain     CXR normal here 10/2011, D-dimer negative.  Resolved with daily use of dexilant.    MEDS:  Outpatient Prescriptions Prior to Visit  Medication Sig Dispense Refill  . dexlansoprazole (DEXILANT) 60 MG capsule Take 60 mg by mouth daily.      . [DISCONTINUED] oxyCODONE-acetaminophen (ROXICET) 5-325 MG per tablet 1-2 tabs po q6h prn  60 tablet  0   Last reviewed on 06/05/2012  1:15 PM by Jeoffrey Massed, MD  PE: Blood pressure 136/82, pulse 71, temperature 98.9 F (37.2 C), temperature source  Temporal, height 5\' 7"  (1.702 m), weight 173 lb (78.472 kg). Gen: Alert, well appearing.  Patient is oriented to person, place, time, and situation. AFFECT: pleasant, lucid thought and speech. ENT:  Eyes: no injection, icteris, swelling, or exudate.  EOMI, PERRLA.   Mouth: lips without lesion/swelling.  Oral mucosa pink and moist.   Oropharynx without erythema, exudate, or swelling.  Neck - No masses or thyromegaly or limitation in range of motion CV: RRR, no m/r/g.   LUNGS: CTA bilat, nonlabored resps, good aeration in all lung fields. ABD: soft, NT, ND, BS normal.  No hepatospenomegaly or mass.  No bruits.  IMPRESSION AND PLAN:  GERD (gastroesophageal reflux disease) Discussed dx, appropriate dietary and lifestyle mod that can help (educational handout was given to pt today), gave 2 bottles of samples of dexilant 60mg  qd and gave rx for this med to take 60mg  qd.  Erectile dysfunction Pt desired rx for cialis 5mg  qd so I did this today.   An After Visit Summary was printed and given to the patient.  FOLLOW UP:  Encouraged pt to make appt at his earliest convenience to discuss his insomnia/anxiety concerns

## 2012-06-05 NOTE — Patient Instructions (Signed)

## 2012-07-19 ENCOUNTER — Telehealth: Payer: Self-pay | Admitting: Family Medicine

## 2012-07-19 MED ORDER — SILDENAFIL CITRATE 100 MG PO TABS: 50.0000 mg | ORAL_TABLET | Freq: Every day | ORAL | Status: DC | PRN

## 2012-07-19 NOTE — Telephone Encounter (Signed)
Pt called requesting samples of Cialis.  Cost of med is $300 to patient.   Advised we do not have any samples nor any coupons.  Pt has taken Viagra before.  I advised pt I do have a coupon for that. To use coupon pt will need RX for #3 Viagra.  Will also need maintenance RX.

## 2012-07-19 NOTE — Telephone Encounter (Signed)
RC from pt while I was at lunch.  PC was taken by Blain Pais.  She advised pt that RX had been sent to pharmacy and he may not be eligible to use.  Pt wants coupon and states he is paying out of pocket for meds anyway.  Lowella Bandy will put coupon at front desk.

## 2012-07-19 NOTE — Telephone Encounter (Signed)
Patient is requesting coupon for viagra. Please contact when ready to pu.

## 2012-07-19 NOTE — Telephone Encounter (Signed)
Rx sent to pharmacy.  He is not eligible for the viagra coupon b/c he has medicare insurance.-thx

## 2012-09-13 ENCOUNTER — Encounter: Payer: Self-pay | Admitting: Family Medicine

## 2012-09-13 ENCOUNTER — Ambulatory Visit (INDEPENDENT_AMBULATORY_CARE_PROVIDER_SITE_OTHER): Payer: Medicare Other | Admitting: Family Medicine

## 2012-09-13 VITALS — BP 112/73 | HR 97 | Temp 98.1°F | Resp 16 | Wt 170.5 lb

## 2012-09-13 DIAGNOSIS — N529 Male erectile dysfunction, unspecified: Secondary | ICD-10-CM
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
## 2013-02-19 ENCOUNTER — Telehealth: Payer: Self-pay | Admitting: Family Medicine

## 2013-02-19 NOTE — Telephone Encounter (Signed)
Patient called to ask if he could get a referall to psychiatry.   I told patient that he did not need a referral for psychiatry and to just call the psychiatrist of his choice and check their availability.

## 2013-03-21 ENCOUNTER — Telehealth: Payer: Self-pay | Admitting: Family Medicine

## 2013-03-21 NOTE — Telephone Encounter (Signed)
Patient is requesting samples of Cialis,

## 2013-03-22 NOTE — Telephone Encounter (Signed)
Cialis is not on patient's medication list.  Please advise samples.

## 2013-03-22 NOTE — Telephone Encounter (Signed)
I have set aside 3 boxes of the 5mg  tabs (#30 in each box). The dosing is 1 tab daily.

## 2013-03-22 NOTE — Telephone Encounter (Signed)
Patient returned called today,states he has not heard anything about samples he requested,please advise pt.

## 2013-03-23 NOTE — Telephone Encounter (Signed)
LMOM for patient to pick up medication at front desk.

## 2013-04-03 DIAGNOSIS — F3160 Bipolar disorder, current episode mixed, unspecified: Secondary | ICD-10-CM | POA: Insufficient documentation

## 2013-06-07 ENCOUNTER — Telehealth: Payer: Self-pay | Admitting: Family Medicine

## 2013-06-07 NOTE — Telephone Encounter (Signed)
Patient is being seen by [facility] - Dr. Sharlotte Alamo. Dr. Lilia Pro can no longer see patient since his medicaid is with [place]. They wwill not give patient his medication. Patient mentioned Vyvanse, Ambien, but could not remember the other ones. Patient did mention that he will have to transfer his care to [facility]. They do not have an appointment available until Feb. Patient is asking if Dr. Anitra Lauth can write these Rx until he can be seen at [facility]. Then patient went on to say that he has left a message for Dr. Lilia Pro to call him back to see if she will write the medication if he has an appointment with [facility]. I advised patient that if she calls back and says she can't write Rx to call us back and make an appointment. Please confirm.

## 2013-06-07 NOTE — Telephone Encounter (Signed)
Please advise.  Patient hasn't been seen in this office since 09/13/12.

## 2013-06-07 NOTE — Telephone Encounter (Signed)
I agree

## 2013-06-07 NOTE — Telephone Encounter (Signed)
Patient will need an OV.

## 2013-06-08 NOTE — Telephone Encounter (Signed)
The phone # the patient left has been disconnected. I left a message to CB on the cell phone # from patient's demographics.

## 2013-06-25 ENCOUNTER — Telehealth: Payer: Self-pay | Admitting: Family Medicine

## 2013-06-25 MED ORDER — TADALAFIL 5 MG PO TABS: 5.0000 mg | ORAL_TABLET | Freq: Every day | ORAL | Status: DC | PRN

## 2013-06-25 NOTE — Telephone Encounter (Signed)
Patient called for samples of cialis 5mg  tabs for daily use.  Dr Anitra Lauth okay with giving patient samples but told me to make patient aware that he should not take prn viagra or cialis with these medications. Patient aware.

## 2013-07-25 ENCOUNTER — Other Ambulatory Visit: Payer: Self-pay

## 2013-07-31 ENCOUNTER — Ambulatory Visit (INDEPENDENT_AMBULATORY_CARE_PROVIDER_SITE_OTHER): Payer: Medicare Other | Admitting: Family Medicine

## 2013-07-31 ENCOUNTER — Telehealth: Payer: Self-pay | Admitting: Family Medicine

## 2013-07-31 ENCOUNTER — Encounter: Payer: Self-pay | Admitting: Family Medicine

## 2013-07-31 VITALS — BP 131/84 | HR 88 | Temp 99.2°F | Resp 18 | Ht 69.0 in | Wt 174.0 lb

## 2013-07-31 DIAGNOSIS — F17200 Nicotine dependence, unspecified, uncomplicated: Secondary | ICD-10-CM | POA: Insufficient documentation

## 2013-07-31 DIAGNOSIS — N529 Male erectile dysfunction, unspecified: Secondary | ICD-10-CM

## 2013-07-31 DIAGNOSIS — K219 Gastro-esophageal reflux disease without esophagitis: Secondary | ICD-10-CM

## 2013-07-31 MED ORDER — DEXLANSOPRAZOLE 60 MG PO CPDR: 60.0000 mg | DELAYED_RELEASE_CAPSULE | Freq: Every day | ORAL | Status: DC
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
## 2013-07-31 NOTE — Progress Notes (Signed)
Pre visit review using our clinic review tool, if applicable. No additional management support is needed unless otherwise documented below in the visit note. 

## 2013-07-31 NOTE — Telephone Encounter (Signed)
Relevant patient education assigned to patient using Emmi. ° °

## 2013-08-27 ENCOUNTER — Emergency Department (HOSPITAL_BASED_OUTPATIENT_CLINIC_OR_DEPARTMENT_OTHER)
Admission: EM | Admit: 2013-08-27 | Discharge: 2013-08-27 | Disposition: A | Discharge status: Home / Self Care | Payer: Medicare Other | Attending: Emergency Medicine | Admitting: Emergency Medicine

## 2013-08-27 ENCOUNTER — Encounter (HOSPITAL_BASED_OUTPATIENT_CLINIC_OR_DEPARTMENT_OTHER): Payer: Self-pay | Admitting: Emergency Medicine

## 2013-08-27 DIAGNOSIS — Z8709 Personal history of other diseases of the respiratory system: Secondary | ICD-10-CM | POA: Insufficient documentation

## 2013-08-27 DIAGNOSIS — F17200 Nicotine dependence, unspecified, uncomplicated: Secondary | ICD-10-CM | POA: Insufficient documentation

## 2013-08-27 DIAGNOSIS — G8929 Other chronic pain: Secondary | ICD-10-CM | POA: Insufficient documentation

## 2013-08-27 DIAGNOSIS — Z8739 Personal history of other diseases of the musculoskeletal system and connective tissue: Secondary | ICD-10-CM | POA: Insufficient documentation

## 2013-08-27 DIAGNOSIS — K029 Dental caries, unspecified: Secondary | ICD-10-CM

## 2013-08-27 DIAGNOSIS — F411 Generalized anxiety disorder: Secondary | ICD-10-CM | POA: Insufficient documentation

## 2013-08-27 DIAGNOSIS — Z79899 Other long term (current) drug therapy: Secondary | ICD-10-CM | POA: Insufficient documentation

## 2013-08-27 DIAGNOSIS — Z880 Allergy status to penicillin: Secondary | ICD-10-CM | POA: Insufficient documentation

## 2013-08-27 DIAGNOSIS — Z8781 Personal history of (healed) traumatic fracture: Secondary | ICD-10-CM | POA: Insufficient documentation

## 2013-08-27 DIAGNOSIS — K089 Disorder of teeth and supporting structures, unspecified: Secondary | ICD-10-CM | POA: Insufficient documentation

## 2013-08-27 DIAGNOSIS — F909 Attention-deficit hyperactivity disorder, unspecified type: Secondary | ICD-10-CM | POA: Insufficient documentation

## 2013-08-27 MED ORDER — IBUPROFEN 800 MG PO TABS: 800.0000 mg | ORAL_TABLET | Freq: Three times a day (TID) | ORAL | Status: DC

## 2013-08-27 MED ORDER — CLINDAMYCIN HCL 150 MG PO CAPS: 150.0000 mg | ORAL_CAPSULE | Freq: Four times a day (QID) | ORAL | Status: DC

## 2013-08-27 NOTE — Discharge Instructions (Signed)
Dental Caries  Dental caries (also called tooth decay) is the most common oral disease. It can occur at any age, but is more common in children and young adults.  HOW DENTAL CARIES DEVELOPS  The process of decay begins when bacteria and foods (particularly sugars and starches) combine in your mouth to produce plaque. Plaque is a substance that sticks to the hard, outer surface of a tooth (enamel). The bacteria in plaque produce acids that attack enamel. These acids may also attack the root surface of a tooth (cementum) if it is exposed. Repeated attacks dissolve these surfaces and create holes in the tooth (cavities). If left untreated, the acids destroy the other layers of the tooth.  RISK FACTORS  Frequent sipping of sugary beverages.   Frequent snacking on sugary and starchy foods, especially those that easily get stuck in the teeth.   Poor oral hygiene.   Dry mouth.   Substance abuse such as methamphetamine abuse.   Broken or poor-fitting dental restorations.   Eating disorders.   Gastroesophageal reflux disease (GERD).   Certain radiation treatments to the head and neck. SYMPTOMS In the early stages of dental caries, symptoms are seldom present. Sometimes Aime, chalky areas may be seen on the enamel or other tooth layers. In later stages, symptoms may include:  Pits and holes on the enamel.  Toothache after sweet, hot, or cold foods or drinks are consumed.  Pain around the tooth.  Swelling around the tooth. DIAGNOSIS  Most of the time, dental caries is detected during a regular dental checkup. A diagnosis is made after a thorough medical and dental history is taken and the surfaces of your teeth are checked for signs of dental caries. Sometimes special instruments, such as lasers, are used to check for dental caries. Dental X-ray exams may be taken so that areas not visible to the eye (such as between the contact areas of the teeth) can be checked for cavities.    TREATMENT  If dental caries is in its early stages, it may be reversed with a fluoride treatment or an application of a remineralizing agent at the dental office. Thorough brushing and flossing at home is needed to aid these treatments. If it is in its later stages, treatment depends on the location and extent of tooth destruction:   If a small area of the tooth has been destroyed, the destroyed area will be removed and cavities will be filled with a material such as gold, silver amalgam, or composite resin.   If a large area of the tooth has been destroyed, the destroyed area will be removed and a cap (crown) will be fitted over the remaining tooth structure.   If the center part of the tooth (pulp) is affected, a procedure called a root canal will be needed before a filling or crown can be placed.   If most of the tooth has been destroyed, the tooth may need to be pulled (extracted). HOME CARE INSTRUCTIONS You can prevent, stop, or reverse dental caries at home by practicing good oral hygiene. Good oral hygiene includes:  Thoroughly cleaning your teeth at least twice a day with a toothbrush and dental floss.   Using a fluoride toothpaste. A fluoride mouth rinse may also be used if recommended by your dentist or health care provider.   Restricting the amount of sugary and starchy foods and sugary liquids you consume.   Avoiding frequent snacking on these foods and sipping of these liquids.   Keeping regular visits  Restricting the amount of sugary and starchy foods and sugary liquids you consume.    · Avoiding frequent snacking on these foods and sipping of these liquids.    · Keeping regular visits with a dentist for checkups and cleanings.  PREVENTION   · Practice good oral hygiene.  · Consider a dental sealant. A dental sealant is a coating material that is applied by your dentist to the pits and grooves of teeth. The sealant prevents food from being trapped in them. It may protect the teeth for several years.  · Ask about fluoride supplements if you live in a community without fluorinated water or with water that has a low fluoride content. Use fluoride supplements  as directed by your dentist or health care provider.  · Allow fluoride varnish applications to teeth if directed by your dentist or health care provider.  Document Released: 01/30/2002 Document Revised: 01/10/2013 Document Reviewed: 05/12/2012  Exit[employer]® Patient Information ©2014 [employer].
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
## 2013-08-27 NOTE — ED Notes (Signed)
Left upper tooth ache x today

## 2013-08-27 NOTE — ED Provider Notes (Signed)
Medical screening examination/treatment/procedure(s) were performed by non-physician practitioner and as supervising physician I was immediately available for consultation/collaboration.   EKG Interpretation None       Veryl Speak, MD 08/27/13 1941

## 2013-10-03 ENCOUNTER — Other Ambulatory Visit: Payer: Self-pay | Admitting: Family Medicine

## 2013-10-08 ENCOUNTER — Telehealth: Payer: Self-pay | Admitting: Family Medicine

## 2013-10-08 DIAGNOSIS — F411 Generalized anxiety disorder: Secondary | ICD-10-CM

## 2013-10-08 DIAGNOSIS — F4001 Agoraphobia with panic disorder: Secondary | ICD-10-CM

## 2013-10-08 NOTE — Telephone Encounter (Signed)
Please advise 

## 2013-10-08 NOTE — Telephone Encounter (Signed)
I need to know if this is a referral to a psychiatrist (MD) or a psychologist (counselor, etc) so I can order the referral correctly (is there a name for the actual psychiatric office--that would be helpful).-thx

## 2013-10-08 NOTE — Telephone Encounter (Signed)
Patient is requesting a psychiatric referral to [facility], [address], [place].

## 2013-10-08 NOTE — Telephone Encounter (Signed)
Marjory Sneddon is an MD psychiatrist at [facility].

## 2013-10-09 NOTE — Telephone Encounter (Signed)
OK. Psychiatrist referral ordered as per pt's request.

## 2013-10-09 NOTE — Telephone Encounter (Signed)
Referral has been moved into [facility]

## 2014-01-21 ENCOUNTER — Telehealth: Payer: Self-pay | Admitting: Family Medicine

## 2014-01-21 NOTE — Telephone Encounter (Signed)
Patient wants to know if we have any co-pay cards for cialis.

## 2014-01-21 NOTE — Telephone Encounter (Signed)
Advised pt we have savings cards but I wasn't sure if he would qualify because he said he had used one before.  Pt is going to check w/ pharmacy and call back if he can use it.

## 2015-07-31 ENCOUNTER — Ambulatory Visit (HOSPITAL_COMMUNITY): Payer: Medicare Other | Admitting: Psychiatry

## 2015-10-24 ENCOUNTER — Encounter: Payer: Self-pay | Admitting: *Deleted

## 2015-10-24 ENCOUNTER — Telehealth: Payer: Self-pay | Admitting: *Deleted

## 2015-10-24 MED ORDER — SILDENAFIL CITRATE 100 MG PO TABS: ORAL_TABLET | ORAL | Status: DC

## 2015-10-24 NOTE — Telephone Encounter (Signed)
Pt called and stated that he would like to get a Rx for sildenafil. He stated that he currently has an Rx for Cialis but it is really expensive. He stated that the sildenafil would be cheaper for him. His last OV with Dr. Anitra Lauth was 07/31/13. I advised pt that he may need to schedule apt in order for a new Rx to be sent. Pt requested I send back a message to Dr. Anitra Lauth. Pharm: Genella Mech Please advise. Thanks.

## 2015-10-24 NOTE — Telephone Encounter (Signed)
Pt advised and voiced understanding.   

## 2015-10-24 NOTE — Telephone Encounter (Signed)
I sent in rx for #10 sildenafil 100mg  tabs, no RF. Must come in for o/v (I suggest a fasting CPE, but it can be a f/u visit for erectile dysfunction if her prefers) prior to any further rx meds from me.-thx

## 2015-11-18 ENCOUNTER — Other Ambulatory Visit: Payer: Self-pay | Admitting: Family Medicine

## 2015-11-18 ENCOUNTER — Telehealth: Payer: Self-pay | Admitting: *Deleted

## 2015-11-18 NOTE — Telephone Encounter (Signed)
Pts wife LMOM on 11/18/15 at 11:51am requesting a call back.  I returned call, pts wife stated that pt started a new job this week and wants to reschedule his apt for tomorrow (11/19/15) because he does not want to miss a day during his first week. She also stated that he needed a medication sent in. I asked if she knew which medication he needed refilled she stated that she did not but we had prescribed it. I advised her that she is not on pts DPR and I can not release any of his PHI to her so he will need to call with the name of the medication. She then stated "well I didn't want to talk to you anyways". I explained that I was sorry I could not provide anymore help and would be happy to help pt if he would call. She voiced understanding and stated that she would have him call.

## 2015-11-18 NOTE — Telephone Encounter (Signed)
Patient calling back today to cancel appt that was scheduled for tomorrow(11/19/15) at 2pm.  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  He states he was informed he could not get another refill without being seen first.  However, he is asking for an exception as he is out of his medication  He is hopin he can come in next week.

## 2015-11-18 NOTE — Telephone Encounter (Signed)
Please advise. Thanks.  

## 2015-11-18 NOTE — Telephone Encounter (Signed)
Is he talking about the sildenafil?  Just making sure before I do any rx's.  Let me know-thx

## 2015-11-18 NOTE — Telephone Encounter (Signed)
Tried calling pt to confirm medication request, number busy will try again later.

## 2015-11-19 ENCOUNTER — Ambulatory Visit: Payer: Medicare Other | Admitting: Family Medicine

## 2015-11-19 MED ORDER — SILDENAFIL CITRATE 100 MG PO TABS: ORAL_TABLET | ORAL | Status: DC

## 2015-11-19 NOTE — Addendum Note (Signed)
Addended by: Onalee Hua on: 11/19/2015 02:06 PM   Modules accepted: Orders

## 2015-11-19 NOTE — Telephone Encounter (Signed)
OK, to RF this x 1 as previously prescribed.-thx

## 2015-11-19 NOTE — Telephone Encounter (Signed)
Spoke to pt he is requesting Sildenafil be sent to [facility]. Please advise. Thanks.

## 2015-11-19 NOTE — Telephone Encounter (Signed)
Rx sent. Pt advised and voiced understanding.  Apt made for 11/24/15 at 3:45pm.

## 2015-11-24 ENCOUNTER — Ambulatory Visit: Payer: Medicare Other | Admitting: Family Medicine

## 2015-11-24 DIAGNOSIS — Z0289 Encounter for other administrative examinations: Secondary | ICD-10-CM

## 2015-12-24 ENCOUNTER — Ambulatory Visit (INDEPENDENT_AMBULATORY_CARE_PROVIDER_SITE_OTHER): Payer: Medicare Other | Admitting: Family Medicine

## 2015-12-24 ENCOUNTER — Encounter: Payer: Self-pay | Admitting: Family Medicine

## 2015-12-24 VITALS — BP 109/68 | HR 72 | Temp 98.2°F | Resp 16 | Ht 69.0 in | Wt 164.5 lb

## 2015-12-24 DIAGNOSIS — N5201 Erectile dysfunction due to arterial insufficiency: Secondary | ICD-10-CM | POA: Diagnosis not present

## 2015-12-24 MED ORDER — SILDENAFIL CITRATE 20 MG PO TABS: ORAL_TABLET | ORAL | 6 refills | Status: DC
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
## 2015-12-24 NOTE — Progress Notes (Signed)
Pre visit review using our clinic review tool, if applicable. No additional management support is needed unless otherwise documented below in the visit note. 

## 2017-02-23 ENCOUNTER — Encounter: Payer: Self-pay | Admitting: Family Medicine

## 2017-02-23 ENCOUNTER — Ambulatory Visit (INDEPENDENT_AMBULATORY_CARE_PROVIDER_SITE_OTHER): Payer: Medicare Other | Admitting: Family Medicine

## 2017-02-23 VITALS — BP 113/68 | HR 63 | Temp 97.9°F | Resp 16 | Ht 69.0 in | Wt 143.2 lb

## 2017-02-23 DIAGNOSIS — F17200 Nicotine dependence, unspecified, uncomplicated: Secondary | ICD-10-CM

## 2017-02-23 DIAGNOSIS — N5201 Erectile dysfunction due to arterial insufficiency: Secondary | ICD-10-CM | POA: Diagnosis not present

## 2017-02-23 MED ORDER — SILDENAFIL CITRATE 20 MG PO TABS: ORAL_TABLET | ORAL | 6 refills | Status: DC
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
## 2018-03-07 ENCOUNTER — Other Ambulatory Visit: Payer: Self-pay | Admitting: Family Medicine

## 2018-03-07 NOTE — Telephone Encounter (Signed)
LMOM for patient to schedule appointment since he has not been seen in one year.  He needs appointment for further refills.

## 2018-03-09 ENCOUNTER — Encounter: Payer: Self-pay | Admitting: Family Medicine

## 2018-03-09 ENCOUNTER — Ambulatory Visit (INDEPENDENT_AMBULATORY_CARE_PROVIDER_SITE_OTHER): Payer: Medicare Other | Admitting: Family Medicine

## 2018-03-09 VITALS — BP 121/67 | HR 62 | Temp 98.2°F | Resp 16 | Ht 69.0 in | Wt 153.2 lb

## 2018-03-09 DIAGNOSIS — N5201 Erectile dysfunction due to arterial insufficiency: Secondary | ICD-10-CM | POA: Diagnosis not present

## 2018-03-09 MED ORDER — SILDENAFIL CITRATE 20 MG PO TABS: ORAL_TABLET | ORAL | 6 refills | Status: DC
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
## 2019-05-11 ENCOUNTER — Other Ambulatory Visit: Payer: Self-pay | Admitting: Family Medicine

## 2019-10-08 ENCOUNTER — Emergency Department (INDEPENDENT_AMBULATORY_CARE_PROVIDER_SITE_OTHER): Payer: Medicare Other

## 2019-10-08 ENCOUNTER — Other Ambulatory Visit: Payer: Self-pay

## 2019-10-08 ENCOUNTER — Emergency Department (INDEPENDENT_AMBULATORY_CARE_PROVIDER_SITE_OTHER)
Admission: EM | Admit: 2019-10-08 | Discharge: 2019-10-08 | Disposition: A | Discharge status: Home / Self Care | Payer: Medicare Other | Source: Home / Self Care | Attending: Family Medicine | Admitting: Family Medicine

## 2019-10-08 DIAGNOSIS — R1310 Dysphagia, unspecified: Secondary | ICD-10-CM

## 2019-10-08 DIAGNOSIS — R109 Unspecified abdominal pain: Secondary | ICD-10-CM | POA: Diagnosis not present

## 2019-10-08 DIAGNOSIS — R112 Nausea with vomiting, unspecified: Secondary | ICD-10-CM | POA: Diagnosis not present

## 2019-10-08 DIAGNOSIS — R1013 Epigastric pain: Secondary | ICD-10-CM | POA: Diagnosis not present

## 2019-10-08 DIAGNOSIS — R1319 Other dysphagia: Secondary | ICD-10-CM

## 2019-10-08 LAB — POCT CBC W AUTO DIFF (K'VILLE URGENT CARE)

## 2019-10-08 LAB — POCT URINALYSIS DIP (MANUAL ENTRY)
Blood, UA: NEGATIVE
Glucose, UA: NEGATIVE mg/dL
Leukocytes, UA: NEGATIVE
Nitrite, UA: NEGATIVE
Protein Ur, POC: >=300 mg/dL — AB
Spec Grav, UA: 1.025 (ref 1.010–1.025)
Urobilinogen, UA: 1 E.U./dL
pH, UA: 7 (ref 5.0–8.0)

## 2019-10-08 MED ORDER — ONDANSETRON HCL 4 MG/2ML IJ SOLN
4.0000 mg | Freq: Once | INTRAMUSCULAR | Status: AC
  Administered 2019-10-08: 4 mg via INTRAMUSCULAR

## 2019-10-08 MED ORDER — OMEPRAZOLE 40 MG PO CPDR: DELAYED_RELEASE_CAPSULE | ORAL | 1 refills | Status: DC

## 2019-10-08 MED ORDER — ALUM & MAG HYDROXIDE-SIMETH 200-200-20 MG/5ML PO SUSP
30.0000 mL | Freq: Once | ORAL | Status: AC
  Administered 2019-10-08: 30 mL via ORAL

## 2019-10-08 MED ORDER — LIDOCAINE VISCOUS HCL 2 % MT SOLN
15.0000 mL | Freq: Once | OROMUCOSAL | Status: AC
  Administered 2019-10-08: 15 mL via ORAL

## 2019-10-08 MED ORDER — SODIUM CHLORIDE 0.9 % IV BOLUS
1000.0000 mL | Freq: Once | INTRAVENOUS | Status: AC
  Administered 2019-10-08: 1000 mL via INTRAVENOUS

## 2019-10-08 MED ORDER — IOHEXOL 300 MG/ML  SOLN
100.0000 mL | Freq: Once | INTRAMUSCULAR | Status: AC | PRN
  Administered 2019-10-08: 100 mL via INTRAVENOUS

## 2019-10-08 MED ORDER — SODIUM CHLORIDE 0.9 % IV SOLN: Freq: Once | INTRAVENOUS | Status: AC

## 2019-10-08 MED ORDER — ONDANSETRON 4 MG PO TBDP: ORAL_TABLET | ORAL | 0 refills | Status: DC

## 2019-10-08 NOTE — ED Triage Notes (Signed)
Patient presents to Urgent Care with complaints of nausea and vomiting since  A week ago. Patient reports he has not tried anything over the counter, left AMA from the hospital last week because "they just stuck and IV in me and tested me for covid".

## 2019-10-08 NOTE — ED Notes (Signed)
Per radiology dept, patients with the pt's insurance type cannot receive CTs with IV contrast on days other than Monday. Patient agreeable to stay for a stat CT, radiology able to perform iStat BUN & creatinine prior to scanning. Dr. Assunta Found aware, orders placed.

## 2019-10-08 NOTE — Discharge Instructions (Addendum)
Increase fluid intake. °If symptoms become significantly worse during the night or over the weekend, proceed to the local emergency room.  °
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
## 2019-10-09 ENCOUNTER — Telehealth: Payer: Self-pay

## 2019-10-09 LAB — COMPLETE METABOLIC PANEL WITH GFR
AG Ratio: 1.7 (calc) (ref 1.0–2.5)
ALT: 24 U/L (ref 9–46)
AST: 26 U/L (ref 10–35)
Albumin: 4.4 g/dL (ref 3.6–5.1)
Alkaline phosphatase (APISO): 82 U/L (ref 35–144)
BUN: 22 mg/dL (ref 7–25)
CO2: 23 mmol/L (ref 20–32)
Calcium: 9.3 mg/dL (ref 8.6–10.3)
Chloride: 101 mmol/L (ref 98–110)
Creat: 0.93 mg/dL (ref 0.70–1.33)
GFR, Est African American: 110 mL/min/{1.73_m2} (ref >=60)
GFR, Est Non African American: 95 mL/min/{1.73_m2} (ref >=60)
Globulin: 2.6 g/dL (calc) (ref 1.9–3.7)
Glucose, Bld: 165 mg/dL — ABNORMAL HIGH (ref 65–99)
Potassium: 4.3 mmol/L (ref 3.5–5.3)
Sodium: 138 mmol/L (ref 135–146)
Total Bilirubin: 0.9 mg/dL (ref 0.2–1.2)
Total Protein: 7 g/dL (ref 6.1–8.1)

## 2019-10-09 LAB — URINE CULTURE
MICRO NUMBER:: 10486204
Result:: NO GROWTH
SPECIMEN QUALITY:: ADEQUATE

## 2019-10-09 LAB — EXTRA LAV TOP TUBE

## 2019-10-09 LAB — AMYLASE: Amylase: 39 U/L (ref 21–101)

## 2019-10-09 LAB — LIPASE: Lipase: 6 U/L — ABNORMAL LOW (ref 7–60)

## 2019-10-09 NOTE — Telephone Encounter (Signed)
Returned patient's phone call to check on yesterday's blood work results. No answer, voicemail left to call this RN back.

## 2019-11-22 DIAGNOSIS — B9681 Helicobacter pylori [H. pylori] as the cause of diseases classified elsewhere: Secondary | ICD-10-CM

## 2019-11-22 HISTORY — DX: Helicobacter pylori (H. pylori) as the cause of diseases classified elsewhere: B96.81

## 2019-12-04 ENCOUNTER — Emergency Department (INDEPENDENT_AMBULATORY_CARE_PROVIDER_SITE_OTHER)
Admission: EM | Admit: 2019-12-04 | Discharge: 2019-12-04 | Disposition: A | Discharge status: Home / Self Care | Payer: Medicare Other | Source: Home / Self Care

## 2019-12-04 ENCOUNTER — Other Ambulatory Visit: Payer: Self-pay

## 2019-12-04 DIAGNOSIS — R197 Diarrhea, unspecified: Secondary | ICD-10-CM

## 2019-12-04 DIAGNOSIS — R112 Nausea with vomiting, unspecified: Secondary | ICD-10-CM | POA: Diagnosis not present
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
## 2019-12-04 NOTE — ED Triage Notes (Signed)
Pt states that he has not been vaccinated

## 2019-12-04 NOTE — ED Triage Notes (Signed)
Pt states that he has been having some nausea, vomiting and diarrhea. Pt states that he feels like he is dehydrated. Pt states that he was here last year for the same thing. Pt states that he has been having symptoms since Friday.

## 2019-12-05 ENCOUNTER — Ambulatory Visit (INDEPENDENT_AMBULATORY_CARE_PROVIDER_SITE_OTHER): Payer: Medicare Other | Admitting: Family Medicine

## 2019-12-05 ENCOUNTER — Telehealth: Payer: Self-pay

## 2019-12-05 ENCOUNTER — Other Ambulatory Visit: Payer: Self-pay

## 2019-12-05 ENCOUNTER — Encounter: Payer: Self-pay | Admitting: Family Medicine

## 2019-12-05 VITALS — BP 139/91 | HR 69 | Temp 99.8°F | Resp 16 | Ht 69.0 in | Wt 153.0 lb

## 2019-12-05 DIAGNOSIS — E860 Dehydration: Secondary | ICD-10-CM | POA: Diagnosis not present

## 2019-12-05 DIAGNOSIS — R197 Diarrhea, unspecified: Secondary | ICD-10-CM

## 2019-12-05 DIAGNOSIS — R112 Nausea with vomiting, unspecified: Secondary | ICD-10-CM | POA: Diagnosis not present

## 2019-12-05 LAB — COMPREHENSIVE METABOLIC PANEL
ALT: 11 U/L (ref 0–53)
AST: 17 U/L (ref 0–37)
Albumin: 4.7 g/dL (ref 3.5–5.2)
Alkaline Phosphatase: 78 U/L (ref 39–117)
BUN: 18 mg/dL (ref 6–23)
CO2: 31 mEq/L (ref 19–32)
Calcium: 9.2 mg/dL (ref 8.4–10.5)
Chloride: 101 mEq/L (ref 96–112)
Creatinine, Ser: 0.88 mg/dL (ref 0.40–1.50)
GFR: 91.21 mL/min (ref >60.00)
Glucose, Bld: 110 mg/dL — ABNORMAL HIGH (ref 70–99)
Potassium: 3.9 mEq/L (ref 3.5–5.1)
Sodium: 140 mEq/L (ref 135–145)
Total Bilirubin: 1.3 mg/dL — ABNORMAL HIGH (ref 0.2–1.2)
Total Protein: 7.2 g/dL (ref 6.0–8.3)

## 2019-12-05 LAB — CBC WITH DIFFERENTIAL/PLATELET
Basophils Absolute: 0 10*3/uL (ref 0.0–0.1)
Basophils Relative: 0.5 % (ref 0.0–3.0)
Eosinophils Absolute: 0 10*3/uL (ref 0.0–0.7)
Eosinophils Relative: 0.2 % (ref 0.0–5.0)
HCT: 47.3 % (ref 39.0–52.0)
Hemoglobin: 16.4 g/dL (ref 13.0–17.0)
Lymphocytes Relative: 23.5 % (ref 12.0–46.0)
Lymphs Abs: 2.3 10*3/uL (ref 0.7–4.0)
MCHC: 34.7 g/dL (ref 30.0–36.0)
MCV: 93.7 fl (ref 78.0–100.0)
Monocytes Absolute: 0.7 10*3/uL (ref 0.1–1.0)
Monocytes Relative: 7.4 % (ref 3.0–12.0)
Neutro Abs: 6.7 10*3/uL (ref 1.4–7.7)
Neutrophils Relative %: 68.4 % (ref 43.0–77.0)
Platelets: 318 10*3/uL (ref 150.0–400.0)
RBC: 5.05 Mil/uL (ref 4.22–5.81)
RDW: 13.1 % (ref 11.5–15.5)
WBC: 9.8 10*3/uL (ref 4.0–10.5)

## 2019-12-05 LAB — LIPASE: Lipase: 15 U/L (ref 11.0–59.0)

## 2019-12-05 MED ORDER — PROMETHAZINE HCL 12.5 MG PO TABS: ORAL_TABLET | ORAL | 0 refills | Status: DC

## 2019-12-05 MED ORDER — OMEPRAZOLE 40 MG PO CPDR: DELAYED_RELEASE_CAPSULE | ORAL | 1 refills | Status: DC

## 2019-12-05 MED ORDER — PROMETHAZINE HCL 12.5 MG PO TABS: 12.5000 mg | ORAL_TABLET | Freq: Four times a day (QID) | ORAL | 0 refills | Status: DC | PRN

## 2019-12-05 NOTE — Telephone Encounter (Signed)
Patient contacted and advised open slot available for today. He just wants to be evaluated by PCP.
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
## 2019-12-05 NOTE — Telephone Encounter (Signed)
Patient seen in ER on 7/13 vomiting and diarrhea.  They told him to follow up with his PCP today.   He is stating that he feels terrible and needs to be seen today. (Dr. Anitra Lauth has an opening at 11:30am, but not sure if we can help him). He mentioned IV for fluids, etc... Patient is stating that he needs help.  I told him that I would have Dr. Idelle Leech clinical assistant call him back.  Please call as soon as possible  [phone].

## 2019-12-06 ENCOUNTER — Encounter: Payer: Self-pay | Admitting: Family Medicine

## 2019-12-06 ENCOUNTER — Other Ambulatory Visit: Payer: Self-pay | Admitting: Family Medicine

## 2019-12-06 LAB — H. PYLORI ANTIBODY, IGG: H Pylori IgG: POSITIVE — AB

## 2019-12-06 LAB — HEPATITIS B SURFACE ANTIGEN: Hepatitis B Surface Ag: NONREACTIVE

## 2019-12-06 LAB — HIV ANTIBODY (ROUTINE TESTING W REFLEX): HIV 1&2 Ab, 4th Generation: NONREACTIVE

## 2019-12-06 LAB — HEPATITIS C ANTIBODY
Hepatitis C Ab: NONREACTIVE
SIGNAL TO CUT-OFF: 0.01 (ref <1.00)

## 2019-12-06 MED ORDER — METRONIDAZOLE 250 MG PO TABS: 250.0000 mg | ORAL_TABLET | Freq: Four times a day (QID) | ORAL | 0 refills | Status: AC

## 2019-12-06 MED ORDER — TETRACYCLINE HCL 500 MG PO CAPS: 500.0000 mg | ORAL_CAPSULE | Freq: Four times a day (QID) | ORAL | 0 refills | Status: DC

## 2019-12-07 ENCOUNTER — Telehealth: Payer: Self-pay

## 2019-12-07 ENCOUNTER — Other Ambulatory Visit: Payer: Self-pay

## 2019-12-07 MED ORDER — DOXYCYCLINE HYCLATE 100 MG PO CAPS: 100.0000 mg | ORAL_CAPSULE | Freq: Two times a day (BID) | ORAL | 0 refills | Status: DC

## 2019-12-07 MED ORDER — CLARITHROMYCIN 500 MG PO TABS
500.0000 mg | ORAL_TABLET | Freq: Two times a day (BID) | ORAL | 0 refills | Status: DC
Start: 2019-12-07 — End: 2020-02-26

## 2019-12-07 NOTE — Telephone Encounter (Signed)
Patient called today to ask if Dr. Anitra Lauth had called pharmacy to change meds. He was seen and prescribed meds but Medicaid will not cover the prescription and he cannot afford them.  He also stated he was talking to nurse and he could not understand what she was saying to him yesterday and would like Britt to call him again today to repeat the conversation.  Patient can be reached at [phone].

## 2019-12-07 NOTE — Telephone Encounter (Signed)
Patient notified new Rx sent in.

## 2019-12-07 NOTE — Telephone Encounter (Signed)
Patient given Rx for tetracycline 500mg  capsules qid x14d, #56 rf 0. Medication is not covered and pharmacy is requesting drug change. I will call patient once this has been done to go over results again.   Please advise, thanks.

## 2019-12-07 NOTE — Telephone Encounter (Addendum)
Patient has pepto bismol liquid. Can he take this instead of getting tabs? Rx sent for doxycycline is not covered. Alternative options are doxycycline monohydrate or minocycline HCL.  Please advise, thanks.

## 2019-12-07 NOTE — Telephone Encounter (Signed)
Newest rx is also not covered by medicaid- pt would like a call once new med has been sent in please.

## 2019-12-07 NOTE — Telephone Encounter (Signed)
OK. Pls eRx clarithromycin 500mg , 1 tab po bid x 14d, #28, no RF.-thx

## 2019-12-07 NOTE — Telephone Encounter (Signed)
Change to doxycycline 100 mg, 1 tab bid x 14d, #28, no RF.  Take metronidazole, omeprazole, and pepto like previously prescribed.-thx

## 2019-12-10 ENCOUNTER — Other Ambulatory Visit: Payer: Self-pay | Admitting: Family Medicine

## 2019-12-10 ENCOUNTER — Telehealth: Payer: Self-pay | Admitting: Family Medicine

## 2019-12-10 MED ORDER — MINOCYCLINE HCL 100 MG PO CAPS
100.0000 mg | ORAL_CAPSULE | Freq: Two times a day (BID) | ORAL | 0 refills | Status: AC
Start: 2019-12-10 — End: 2019-12-24

## 2019-12-10 NOTE — Telephone Encounter (Signed)
FYI, additional message from conversation with Hinton Dyer.   "Patient calling again to follow up from his call earlier to see if a new medication has been called in to the pharmacy. He asked to speak with Dr. Anitra Lauth or his nurse. I sent Vevelyn Royals a message letting her know that he was on the phone again, and she is waiting on Dr. Idelle Leech response. He asked me why isnt his medicaid is paying for his meds.  I said his Medicaid had recently changed and maybe his benefits did.  I told him as soon as someone could; they will call him. He said ma'am dont you understand that I am sick, my doctor told me last week that I needed this antibiotic To clear up infection in my stomach. I told him I was sorry, and someone would call him...then He started raising his voice and Have my doctor call me and then he hung up phone"

## 2019-12-10 NOTE — Telephone Encounter (Signed)
Noted  

## 2019-12-10 NOTE — Telephone Encounter (Signed)
Patient was last seen 7/14 and given Rx for metronidazole and tetracycline. He was able to get metronidazole but we have sent in 2 different meds to replace tetracycline due to not being covered. 1) Doxycycline 100mg  x 14d, #28 2) Clarithromycin 500mg  x14d, #28.   Please advise what else we can send in. Patient has already started metronidazole.

## 2019-12-10 NOTE — Telephone Encounter (Signed)
Patient had no issues with receiving medication. He was not rude at all. His concern is he only took 2 the first day, 7/15 and the next day he started taking 4 tabs per day but nothing since then. He wants to know if he should continue taking 4 tablets per day with 50 tabs left.  Please advise, thanks.

## 2019-12-10 NOTE — Telephone Encounter (Signed)
I eRx'd minocycline just now. When you call him let me know if he is rude to you.-thx

## 2019-12-10 NOTE — Telephone Encounter (Signed)
Patient calling again to follow up from his call earlier to see if a new medication has been called in to the pharmacy. He asked to speak with Dr. Anitra Lauth or his nurse. I sent Vevelyn Royals a message letting her know that he was on the phone again, and she is waiting on Dr. Idelle Leech response. He asked me why isnt his medicaid is paying for his meds.  I said his Medicaid had recently changed and maybe his benefits did.  I told him as soon as someone could; they will call him. He said ma'am dont you understand that I am sick, my doctor told me last week that I needed this antibiotic To clear up infection in my stomach. I told him I was sorry, and someone would call him...then He started raising his voice and Have my doctor call me and then he hung up phone.   Please call patient [phone]

## 2019-12-10 NOTE — Telephone Encounter (Signed)
Patient states that meds that were called in on Friday are not covered by Medicaid.  He is very concerned about getting meds soon because Dr. Anitra Lauth said he had some kind of "poision" in his stomach.  Please call patient as soon as possible this morning. [phone].  Thank you

## 2019-12-10 NOTE — Telephone Encounter (Signed)
Patient advised and voiced understanding.  

## 2019-12-10 NOTE — Telephone Encounter (Signed)
I assume you are talking about the metronidazole? He SHOULD continue taking the metronidazole 4 times a day until all pills are gone.

## 2019-12-12 ENCOUNTER — Telehealth: Payer: Self-pay

## 2019-12-12 NOTE — Telephone Encounter (Signed)
Spoke with patient and he has not been taking the medicine with food. He is not tolerating metronidazole abx that well so he decided to take phenergan to help and that caused him to throw up. Patient advised PCP wrote in last o/v note, if worsening to go to ED. He will go to ED if this continues.

## 2019-12-12 NOTE — Telephone Encounter (Signed)
Patient reports he is throwing up and very sick on his stomach taking new antibiotic. He is taking the nausea medicine that was prescribed and it is not helping. He would like to talk to Dr. Idelle Leech clinical assistant as soon as possible to see if he just needs to go on to the ED?  Please call [phone]

## 2019-12-20 ENCOUNTER — Other Ambulatory Visit: Payer: Self-pay

## 2019-12-20 ENCOUNTER — Emergency Department (HOSPITAL_BASED_OUTPATIENT_CLINIC_OR_DEPARTMENT_OTHER): Payer: Medicare Other

## 2019-12-20 ENCOUNTER — Telehealth: Payer: Self-pay

## 2019-12-20 ENCOUNTER — Emergency Department (HOSPITAL_BASED_OUTPATIENT_CLINIC_OR_DEPARTMENT_OTHER)
Admission: EM | Admit: 2019-12-20 | Discharge: 2019-12-20 | Disposition: A | Discharge status: Home / Self Care | Payer: Medicare Other | Attending: Emergency Medicine | Admitting: Emergency Medicine

## 2019-12-20 ENCOUNTER — Encounter (HOSPITAL_BASED_OUTPATIENT_CLINIC_OR_DEPARTMENT_OTHER): Payer: Self-pay

## 2019-12-20 DIAGNOSIS — M549 Dorsalgia, unspecified: Secondary | ICD-10-CM | POA: Diagnosis not present

## 2019-12-20 DIAGNOSIS — R109 Unspecified abdominal pain: Secondary | ICD-10-CM | POA: Diagnosis not present

## 2019-12-20 DIAGNOSIS — R1310 Dysphagia, unspecified: Secondary | ICD-10-CM

## 2019-12-20 DIAGNOSIS — Z79899 Other long term (current) drug therapy: Secondary | ICD-10-CM | POA: Diagnosis not present

## 2019-12-20 DIAGNOSIS — J029 Acute pharyngitis, unspecified: Secondary | ICD-10-CM | POA: Insufficient documentation

## 2019-12-20 DIAGNOSIS — K219 Gastro-esophageal reflux disease without esophagitis: Secondary | ICD-10-CM | POA: Diagnosis not present

## 2019-12-20 DIAGNOSIS — F17210 Nicotine dependence, cigarettes, uncomplicated: Secondary | ICD-10-CM | POA: Diagnosis not present

## 2019-12-20 LAB — CBC WITH DIFFERENTIAL/PLATELET
Abs Immature Granulocytes: 0.01 10*3/uL (ref 0.00–0.07)
Basophils Absolute: 0.1 10*3/uL (ref 0.0–0.1)
Basophils Relative: 1 %
Eosinophils Absolute: 0.1 10*3/uL (ref 0.0–0.5)
Eosinophils Relative: 2 %
HCT: 45.6 % (ref 39.0–52.0)
Hemoglobin: 15.8 g/dL (ref 13.0–17.0)
Immature Granulocytes: 0 %
Lymphocytes Relative: 34 %
Lymphs Abs: 2.1 10*3/uL (ref 0.7–4.0)
MCH: 32 pg (ref 26.0–34.0)
MCHC: 34.6 g/dL (ref 30.0–36.0)
MCV: 92.3 fL (ref 80.0–100.0)
Monocytes Absolute: 0.5 10*3/uL (ref 0.1–1.0)
Monocytes Relative: 8 %
Neutro Abs: 3.3 10*3/uL (ref 1.7–7.7)
Neutrophils Relative %: 55 %
Platelets: 297 10*3/uL (ref 150–400)
RBC: 4.94 MIL/uL (ref 4.22–5.81)
RDW: 12.4 % (ref 11.5–15.5)
WBC: 6 10*3/uL (ref 4.0–10.5)
nRBC: 0 % (ref 0.0–0.2)

## 2019-12-20 LAB — URINALYSIS, ROUTINE W REFLEX MICROSCOPIC
Bilirubin Urine: NEGATIVE
Glucose, UA: NEGATIVE mg/dL
Hgb urine dipstick: NEGATIVE
Ketones, ur: NEGATIVE mg/dL
Leukocytes,Ua: NEGATIVE
Nitrite: NEGATIVE
Protein, ur: NEGATIVE mg/dL
Specific Gravity, Urine: 1.025 (ref 1.005–1.030)
pH: 8 (ref 5.0–8.0)

## 2019-12-20 LAB — BASIC METABOLIC PANEL
Anion gap: 10 (ref 5–15)
BUN: 14 mg/dL (ref 6–20)
CO2: 24 mmol/L (ref 22–32)
Calcium: 8.7 mg/dL — ABNORMAL LOW (ref 8.9–10.3)
Chloride: 106 mmol/L (ref 98–111)
Creatinine, Ser: 0.89 mg/dL (ref 0.61–1.24)
GFR calc Af Amer: >60 mL/min (ref >60)
GFR calc non Af Amer: >60 mL/min (ref >60)
Glucose, Bld: 104 mg/dL — ABNORMAL HIGH (ref 70–99)
Potassium: 4.8 mmol/L (ref 3.5–5.1)
Sodium: 140 mmol/L (ref 135–145)

## 2019-12-20 MED ORDER — CLARITHROMYCIN 125 MG/5ML PO SUSR
500.0000 mg | Freq: Two times a day (BID) | ORAL | 0 refills | Status: AC
Start: 2019-12-20 — End: 2019-12-30

## 2019-12-20 MED ORDER — SODIUM CHLORIDE 0.9 % IV BOLUS
1000.0000 mL | Freq: Once | INTRAVENOUS | Status: AC
  Administered 2019-12-20: 1000 mL via INTRAVENOUS

## 2019-12-20 MED ORDER — METRONIDAZOLE 50 MG/ML ORAL SUSPENSION: 500.0000 mg | Freq: Two times a day (BID) | ORAL | 0 refills | Status: AC

## 2019-12-20 NOTE — Telephone Encounter (Signed)
FYI Patient called to request an appt with Dr. Anitra Lauth. I told him that Dr. Anitra Lauth was not in office today. He said that he had not pooped or urinated in 2 days and what should he do.  He said he needed to be seen by someone right now. I told him to go to urgent care or go to [facility] ED. He said that is where he wanted to go and asked me what the address was. I gave him the address of [facility] ED.   I will forward to Dr. Idelle Leech clinical assistant to follow up with patient if needed.

## 2019-12-20 NOTE — Telephone Encounter (Signed)
Sent as FYI. 

## 2019-12-20 NOTE — Discharge Instructions (Addendum)
Your CT scan and urine studies and bloodwork were reassuring.  I did not see problems with your kidneys.  I prescribed liquid antibiotics to replace your METRONIDAZOLE (Flagyl) and CLARITHROMYCIN pills.   I also placed a referral to our GI clinic for your swallowing issues.
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
## 2019-12-20 NOTE — ED Triage Notes (Signed)
Pt was recently diagnosed with h.Pylori, prescribed antibiotics which he is unable to swallow, now reports difficulty moving bowels, LBM yesterday, states urinating less.  Able to drink adequate fluids.  Pt states saw his doctor today who sent him here.

## 2019-12-20 NOTE — Telephone Encounter (Signed)
noted 

## 2019-12-27 ENCOUNTER — Encounter: Payer: Self-pay | Admitting: Gastroenterology

## 2020-02-26 ENCOUNTER — Ambulatory Visit (INDEPENDENT_AMBULATORY_CARE_PROVIDER_SITE_OTHER): Payer: Medicare Other | Admitting: Gastroenterology

## 2020-02-26 ENCOUNTER — Encounter: Payer: Self-pay | Admitting: Gastroenterology

## 2020-02-26 VITALS — BP 110/70 | HR 80 | Ht 68.0 in | Wt 157.5 lb

## 2020-02-26 DIAGNOSIS — R1013 Epigastric pain: Secondary | ICD-10-CM | POA: Diagnosis not present

## 2020-02-26 DIAGNOSIS — Z1211 Encounter for screening for malignant neoplasm of colon: Secondary | ICD-10-CM | POA: Diagnosis not present

## 2020-02-26 DIAGNOSIS — A048 Other specified bacterial intestinal infections: Secondary | ICD-10-CM

## 2020-02-26 MED ORDER — PLENVU 140 G PO SOLR
ORAL | 0 refills | Status: DC
Start: 2020-02-26 — End: 2020-03-28

## 2020-02-26 NOTE — Patient Instructions (Signed)
If you are age 51 or older, your body mass index should be between 23-30. Your Body mass index is 23.95 kg/m. If this is out of the aforementioned range listed, please consider follow up with your Primary Care Provider.  If you are age 53 or younger, your body mass index should be between 19-25. Your Body mass index is 23.95 kg/m. If this is out of the aformentioned range listed, please consider follow up with your Primary Care Provider.   You have been scheduled for an endoscopy and colonoscopy. Please follow the written instructions given to you at your visit today. Please pick up your prep supplies at the pharmacy within the next 1-3 days. If you use inhalers (even only as needed), please bring them with you on the day of your procedure.  It was a pleasure to see you today!  Dr. Loletha Carrow
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
## 2020-03-10 ENCOUNTER — Telehealth: Payer: Self-pay | Admitting: Family Medicine

## 2020-03-10 NOTE — Telephone Encounter (Signed)
Pt states it has been going on for about 2 months. He was not able to take Rx that was given to him for H Pylori because he could not swallow it. He says he has tried to put it in a banana and thinks it is pointless to start meds again after 2 months since he could not take it before. I have scheduled him for OV 03/28/20, he can only do late afternoon appointments.

## 2020-03-10 NOTE — Telephone Encounter (Signed)
Pls call and get more specific info.  I've got nothing to go on here.Marland KitchenMarland KitchenMarland Kitchen

## 2020-03-10 NOTE — Telephone Encounter (Signed)
Patient states in July, he tested positive with [place] for H. Pylori. He was not able to finish medications given to him due to problems swallowing. Patient called today and has metallic taste in his mouth x 3 days. Please call patient to advise.

## 2020-03-10 NOTE — Telephone Encounter (Signed)
Noted  

## 2020-03-28 ENCOUNTER — Other Ambulatory Visit: Payer: Self-pay

## 2020-03-28 ENCOUNTER — Telehealth: Payer: Self-pay

## 2020-03-28 ENCOUNTER — Encounter: Payer: Self-pay | Admitting: Family Medicine

## 2020-03-28 ENCOUNTER — Ambulatory Visit (INDEPENDENT_AMBULATORY_CARE_PROVIDER_SITE_OTHER): Payer: Medicare Other | Admitting: Family Medicine

## 2020-03-28 VITALS — BP 106/70 | HR 93 | Temp 98.5°F | Resp 16 | Ht 68.0 in | Wt 161.0 lb

## 2020-03-28 DIAGNOSIS — K219 Gastro-esophageal reflux disease without esophagitis: Secondary | ICD-10-CM

## 2020-03-28 DIAGNOSIS — R438 Other disturbances of smell and taste: Secondary | ICD-10-CM

## 2020-03-28 NOTE — Telephone Encounter (Signed)
Noted  

## 2020-03-28 NOTE — Telephone Encounter (Signed)
Tried calling patient's pharmacy to speak with pharmacist to see whether or not proton pump inhibitor offered in suspension or dissolvable tablets.
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
## 2020-03-29 MED ORDER — ESOMEPRAZOLE MAGNESIUM 40 MG PO PACK: 40.0000 mg | PACK | Freq: Two times a day (BID) | ORAL | 1 refills | Status: DC

## 2020-04-02 ENCOUNTER — Telehealth: Payer: Self-pay

## 2020-04-02 NOTE — Telephone Encounter (Signed)
I never could get through to pharmacist last week (waited forever on hold, like you) so I just chose omeprazole packets. In order to avoid rx'ing things and getting them denied I really need input from his pharmacy about oral suspension proton pump inhibitors that they have available and then I'll take it from there.

## 2020-04-02 NOTE — Telephone Encounter (Signed)
Received fax from pharmacy that esomeprazole packets not covered and would require PA. Please advise if you would like PA completed or alternative medication sent, thanks. Pt ID # is [id number]. Plan # is [phone]

## 2020-04-03 NOTE — Telephone Encounter (Signed)
Fax received for additional information for coverage, Placed on PCP desk for assistance. You can place back on my desk when complete.

## 2020-04-03 NOTE — Telephone Encounter (Signed)
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 Anitra Lauth pt has tried and failed    Waiting for response.  Pt is aware as well.

## 2020-04-03 NOTE — Telephone Encounter (Signed)
Paperwork completed and handed to [place]. Signed:  Crissie Sickles, MD           04/03/2020

## 2020-04-04 NOTE — Telephone Encounter (Signed)
Coverage Starts on: 05/25/2019 12:00:00 AM, Coverage Ends on: 05/23/2021 12:00:00 AM. Patient was notified medication should be available at the pharmacy.

## 2020-04-24 ENCOUNTER — Encounter: Payer: Medicare Other | Admitting: Gastroenterology

## 2020-05-02 ENCOUNTER — Telehealth: Payer: Self-pay

## 2020-05-02 NOTE — Telephone Encounter (Signed)
Pt was scheduled to see Dr. Loletha Carrow on 05/06/20 at [facility] to verify if his covid test was rescheduled for his procedure. Pt stated that he was not aware that he had to get a covid test done. Explained to patient that per [place] that if he was not vaccinated he needed to have a negative covid test done prior to procedure. Pt refused to get a covid test. Stated that "he will just reschedule his procedure somewhere else".

## 2020-05-05 NOTE — Telephone Encounter (Signed)
Understood, thanks

## 2020-05-06 ENCOUNTER — Encounter: Payer: Medicare Other | Admitting: Gastroenterology

## 2020-09-19 ENCOUNTER — Other Ambulatory Visit: Payer: Self-pay | Admitting: Family Medicine

## 2020-09-19 NOTE — Telephone Encounter (Addendum)
Patient refill request   Patient requested to have refill called in today before 5pm.  I told patient I could not guarantee that it would be approved before that time.  sildenafil (REVATIO)   [facility]   Patient calling back regarding refill request submitted on Friday.  He asked to speak wutg

## 2020-09-22 ENCOUNTER — Encounter: Payer: Self-pay | Admitting: Family Medicine

## 2020-09-22 NOTE — Telephone Encounter (Signed)
Pt was contacted by the pharmacy regarding rx.

## 2020-09-22 NOTE — Telephone Encounter (Signed)
RF request for Sildenafil  LOV: 03/28/20 Next ov: n/a  Last written: 05/11/19(50,0)  Please Advise. Medication pending and not on current med list.

## 2020-11-28 ENCOUNTER — Other Ambulatory Visit: Payer: Self-pay | Admitting: Family Medicine

## 2020-12-03 ENCOUNTER — Telehealth: Payer: Self-pay

## 2020-12-03 MED ORDER — SILDENAFIL CITRATE 20 MG PO TABS: ORAL_TABLET | ORAL | 0 refills | Status: DC

## 2020-12-03 NOTE — Telephone Encounter (Signed)
Pt notified refill sent until appt on 7/27.

## 2020-12-03 NOTE — Telephone Encounter (Signed)
Patient refill request. Patient scheduled appt for 7/27 with Dr. Anitra Lauth.  He is requesting enough meds to carry him thru to appt.  sildenafil (REVATIO) 20 MG tablet [[id number]]    [place], [place]

## 2020-12-10 ENCOUNTER — Ambulatory Visit: Payer: Medicare Other

## 2020-12-17 ENCOUNTER — Encounter: Payer: Self-pay | Admitting: Family Medicine

## 2020-12-17 ENCOUNTER — Ambulatory Visit (INDEPENDENT_AMBULATORY_CARE_PROVIDER_SITE_OTHER): Payer: Medicare Other | Admitting: Family Medicine

## 2020-12-17 ENCOUNTER — Other Ambulatory Visit: Payer: Self-pay

## 2020-12-17 VITALS — BP 112/69 | HR 81 | Temp 98.0°F | Resp 16 | Ht 68.0 in | Wt 169.0 lb

## 2020-12-17 DIAGNOSIS — K219 Gastro-esophageal reflux disease without esophagitis: Secondary | ICD-10-CM | POA: Diagnosis not present

## 2020-12-17 DIAGNOSIS — J309 Allergic rhinitis, unspecified: Secondary | ICD-10-CM | POA: Diagnosis not present

## 2020-12-17 DIAGNOSIS — N529 Male erectile dysfunction, unspecified: Secondary | ICD-10-CM

## 2020-12-17 MED ORDER — LANSOPRAZOLE 30 MG PO CPDR: 30.0000 mg | DELAYED_RELEASE_CAPSULE | Freq: Every day | ORAL | 6 refills | Status: DC

## 2020-12-17 MED ORDER — MONTELUKAST SODIUM 10 MG PO TABS
10.0000 mg | ORAL_TABLET | Freq: Every day | ORAL | 6 refills | Status: DC
Start: 2020-12-17 — End: 2021-10-12

## 2020-12-17 MED ORDER — SILDENAFIL CITRATE 20 MG PO TABS: ORAL_TABLET | ORAL | 6 refills | Status: DC

## 2020-12-17 NOTE — Patient Instructions (Signed)
Call [facility] at  [phone]  Tell them you are interested in rescheduling the endoscopy procedures you were supposed to have gotten in December last year.    For gas/need to burp, Try over the counter medication called simethicone ("Gas-ex")---take as directed on the packaging.
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
## 2020-12-22 ENCOUNTER — Telehealth: Payer: Self-pay | Admitting: Family Medicine

## 2020-12-22 NOTE — Telephone Encounter (Signed)
Irritable patient states his insurance will not pay for Prevacid RX. He says Dr. Anitra Lauth needs to call "someone" to have it approved but patient did not have any further information. He also asked if Dr. Anitra Lauth could prescribe something different that insurance will pay for.

## 2020-12-22 NOTE — Telephone Encounter (Signed)
Pls tell pt we have no way of knowing what his insurance will cover. I've rx'd him dexilant, prilosec, protonix, and nexium and he says they either don't help or his insurer won't cover. I will rx whatever his insurer says they will cover. Thank you for speaking with him.  Let me know if he lashes out at you.

## 2020-12-22 NOTE — Telephone Encounter (Signed)
  Cody Conway (Key: [id number]) - [id number] Lansoprazole '30MG'$  dr capsules     Status: PA Response - Denied  Created: July 27th, 2022 [phone]  Sent: July 28th, 2022

## 2020-12-23 NOTE — Telephone Encounter (Signed)
Spoke with patient to advise PA was denied and best option would be to contact insurer to find out formulary. He mentioned he may have issues since this is medicare and medicaid but advised this option would alleviate sending medication that is not covered and help him get something else quicker. He voiced understanding and will call us back with update

## 2020-12-24 ENCOUNTER — Ambulatory Visit (INDEPENDENT_AMBULATORY_CARE_PROVIDER_SITE_OTHER): Payer: Medicare Other | Admitting: *Deleted

## 2020-12-24 DIAGNOSIS — Z0000 Encounter for general adult medical examination without abnormal findings: Secondary | ICD-10-CM | POA: Diagnosis not present

## 2020-12-24 NOTE — Patient Instructions (Signed)
Mr. Cody Conway , Thank you for taking time to come for your Medicare Wellness Visit. I appreciate your ongoing commitment to your health goals. Please review the following plan we discussed and let me know if I can assist you in the future.   Screening recommendations/referrals: Colonoscopy: education provided Recommended yearly ophthalmology/optometry visit for glaucoma screening and checkup Recommended yearly dental visit for hygiene and checkup  Vaccinations: Influenza vaccine: Education provided  Tdap vaccine: up to date Shingles vaccine: Education provided    Advanced directives: Education provided  Conditions/risks identified: na    Preventive Care 52 Years and Older, Male Preventive care refers to lifestyle choices and visits with your health care provider that can promote health and wellness. What does preventive care include? A yearly physical exam. This is also called an annual well check. Dental exams once or twice a year. Routine eye exams. Ask your health care provider how often you should have your eyes checked. Personal lifestyle choices, including: Daily care of your teeth and gums. Regular physical activity. Eating a healthy diet. Avoiding tobacco and drug use. Limiting alcohol use. Practicing safe sex. Taking low doses of aspirin every day. Taking vitamin and mineral supplements as recommended by your health care provider. What happens during an annual well check? The services and screenings done by your health care provider during your annual well check will depend on your age, overall health, lifestyle risk factors, and family history of disease. Counseling  Your health care provider may ask you questions about your: Alcohol use. Tobacco use. Drug use. Emotional well-being. Home and relationship well-being. Sexual activity. Eating habits. History of falls. Memory and ability to understand (cognition). Work and work Statistician. Screening  You may have  the following tests or measurements: Height, weight, and BMI. Blood pressure. Lipid and cholesterol levels. These may be checked every 5 years, or more frequently if you are over 34 years old. Skin check. Lung cancer screening. You may have this screening every year starting at age 78 if you have a 30-pack-year history of smoking and currently smoke or have quit within the past 15 years. Fecal occult blood test (FOBT) of the stool. You may have this test every year starting at age 50. Flexible sigmoidoscopy or colonoscopy. You may have a sigmoidoscopy every 5 years or a colonoscopy every 10 years starting at age 6. Prostate cancer screening. Recommendations will vary depending on your family history and other risks. Hepatitis C blood test. Hepatitis B blood test. Sexually transmitted disease (STD) testing. Diabetes screening. This is done by checking your blood sugar (glucose) after you have not eaten for a while (fasting). You may have this done every 1-3 years. Abdominal aortic aneurysm (AAA) screening. You may need this if you are a current or former smoker. Osteoporosis. You may be screened starting at age 10 if you are at high risk. Talk with your health care provider about your test results, treatment options, and if necessary, the need for more tests. Vaccines  Your health care provider may recommend certain vaccines, such as: Influenza vaccine. This is recommended every year. Tetanus, diphtheria, and acellular pertussis (Tdap, Td) vaccine. You may need a Td booster every 10 years. Zoster vaccine. You may need this after age 12. Pneumococcal 13-valent conjugate (PCV13) vaccine. One dose is recommended after age 60. Pneumococcal polysaccharide (PPSV23) vaccine. One dose is recommended after age 81. Talk to your health care provider about which screenings and vaccines you need and how often you need them. This information is  not intended to replace advice given to you by your health  care provider. Make sure you discuss any questions you have with your health care provider. Document Released: 06/06/2015 Document Revised: 01/28/2016 Document Reviewed: 03/11/2015 Elsevier Interactive Patient Education  2017 [place] Prevention in the Home Falls can cause injuries. They can happen to people of all ages. There are many things you can do to make your home safe and to help prevent falls. What can I do on the outside of my home? Regularly fix the edges of walkways and driveways and fix any cracks. Remove anything that might make you trip as you walk through a door, such as a raised step or threshold. Trim any bushes or trees on the path to your home. Use bright outdoor lighting. Clear any walking paths of anything that might make someone trip, such as rocks or tools. Regularly check to see if handrails are loose or broken. Make sure that both sides of any steps have handrails. Any raised decks and porches should have guardrails on the edges. Have any leaves, snow, or ice cleared regularly. Use sand or salt on walking paths during winter. Clean up any spills in your garage right away. This includes oil or grease spills. What can I do in the bathroom? Use night lights. Install grab bars by the toilet and in the tub and shower. Do not use towel bars as grab bars. Use non-skid mats or decals in the tub or shower. If you need to sit down in the shower, use a plastic, non-slip stool. Keep the floor dry. Clean up any water that spills on the floor as soon as it happens. Remove soap buildup in the tub or shower regularly. Attach bath mats securely with double-sided non-slip rug tape. Do not have throw rugs and other things on the floor that can make you trip. What can I do in the bedroom? Use night lights. Make sure that you have a light by your bed that is easy to reach. Do not use any sheets or blankets that are too big for your bed. They should not hang down onto the  floor. Have a firm chair that has side arms. You can use this for support while you get dressed. Do not have throw rugs and other things on the floor that can make you trip. What can I do in the kitchen? Clean up any spills right away. Avoid walking on wet floors. Keep items that you use a lot in easy-to-reach places. If you need to reach something above you, use a strong step stool that has a grab bar. Keep electrical cords out of the way. Do not use floor polish or wax that makes floors slippery. If you must use wax, use non-skid floor wax. Do not have throw rugs and other things on the floor that can make you trip. What can I do with my stairs? Do not leave any items on the stairs. Make sure that there are handrails on both sides of the stairs and use them. Fix handrails that are broken or loose. Make sure that handrails are as long as the stairways. Check any carpeting to make sure that it is firmly attached to the stairs. Fix any carpet that is loose or worn. Avoid having throw rugs at the top or bottom of the stairs. If you do have throw rugs, attach them to the floor with carpet tape. Make sure that you have a light switch at the top of the  stairs and the bottom of the stairs. If you do not have them, ask someone to add them for you. What else can I do to help prevent falls? Wear shoes that: Do not have high heels. Have rubber bottoms. Are comfortable and fit you well. Are closed at the toe. Do not wear sandals. If you use a stepladder: Make sure that it is fully opened. Do not climb a closed stepladder. Make sure that both sides of the stepladder are locked into place. Ask someone to hold it for you, if possible. Clearly mark and make sure that you can see: Any grab bars or handrails. First and last steps. Where the edge of each step is. Use tools that help you move around (mobility aids) if they are needed. These include: Canes. Walkers. Scooters. Crutches. Turn on the  lights when you go into a dark area. Replace any light bulbs as soon as they burn out. Set up your furniture so you have a clear path. Avoid moving your furniture around. If any of your floors are uneven, fix them. If there are any pets around you, be aware of where they are. Review your medicines with your doctor. Some medicines can make you feel dizzy. This can increase your chance of falling. Ask your doctor what other things that you can do to help prevent falls. This information is not intended to replace advice given to you by your health care provider. Make sure you discuss any questions you have with your health care provider. Document Released: 03/06/2009 Document Revised: 10/16/2015 Document Reviewed: 06/14/2014 Elsevier Interactive Patient Education  2017 [employer].
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
## 2020-12-26 ENCOUNTER — Telehealth: Payer: Self-pay

## 2020-12-26 NOTE — Telephone Encounter (Signed)
Written notice received from [facility] for drug change request, lansoprazole is not covered by pt's plan. Preferred alternatives are pantoprazole sodium, Dexilant, or famotidine.

## 2020-12-29 ENCOUNTER — Other Ambulatory Visit: Payer: Self-pay

## 2020-12-29 MED ORDER — DEXLANSOPRAZOLE 60 MG PO CPDR: 60.0000 mg | DELAYED_RELEASE_CAPSULE | Freq: Two times a day (BID) | ORAL | 3 refills | Status: DC

## 2020-12-29 NOTE — Addendum Note (Signed)
Addended by: Tammi Sou on: 12/29/2020 08:02 AM   Modules accepted: Orders

## 2020-12-29 NOTE — Telephone Encounter (Signed)
OK, dexilant erx'd to [facility] in [place].

## 2020-12-29 NOTE — Telephone Encounter (Signed)
Spoke with pt regarding med update, he advised he was able to get original rx for lansoprazole using good rx coupon. Nothing further needed

## 2021-01-19 ENCOUNTER — Encounter: Payer: Self-pay | Admitting: Gastroenterology

## 2021-02-25 ENCOUNTER — Other Ambulatory Visit: Payer: Self-pay

## 2021-02-25 ENCOUNTER — Emergency Department (HOSPITAL_BASED_OUTPATIENT_CLINIC_OR_DEPARTMENT_OTHER)
Admission: EM | Admit: 2021-02-25 | Discharge: 2021-02-25 | Discharge status: Against Medical Advice | Payer: Medicare Other | Attending: Emergency Medicine | Admitting: Emergency Medicine

## 2021-02-25 ENCOUNTER — Encounter (HOSPITAL_BASED_OUTPATIENT_CLINIC_OR_DEPARTMENT_OTHER): Payer: Self-pay

## 2021-02-25 DIAGNOSIS — R059 Cough, unspecified: Secondary | ICD-10-CM | POA: Insufficient documentation

## 2021-02-25 DIAGNOSIS — F17210 Nicotine dependence, cigarettes, uncomplicated: Secondary | ICD-10-CM | POA: Diagnosis not present

## 2021-02-25 DIAGNOSIS — R112 Nausea with vomiting, unspecified: Secondary | ICD-10-CM | POA: Diagnosis not present

## 2021-02-25 DIAGNOSIS — Y900 Blood alcohol level of less than 20 mg/100 ml: Secondary | ICD-10-CM | POA: Insufficient documentation

## 2021-02-25 DIAGNOSIS — Z79899 Other long term (current) drug therapy: Secondary | ICD-10-CM | POA: Diagnosis not present

## 2021-02-25 LAB — URINALYSIS, ROUTINE W REFLEX MICROSCOPIC
Bilirubin Urine: NEGATIVE
Glucose, UA: NEGATIVE mg/dL
Hgb urine dipstick: NEGATIVE
Ketones, ur: NEGATIVE mg/dL
Leukocytes,Ua: NEGATIVE
Nitrite: NEGATIVE
Protein, ur: NEGATIVE mg/dL
Specific Gravity, Urine: 1.01 (ref 1.005–1.030)
pH: 7 (ref 5.0–8.0)

## 2021-02-25 LAB — RAPID URINE DRUG SCREEN, HOSP PERFORMED
Amphetamines: NOT DETECTED
Barbiturates: NOT DETECTED
Benzodiazepines: POSITIVE — AB
Cocaine: NOT DETECTED
Opiates: NOT DETECTED
Tetrahydrocannabinol: POSITIVE — AB

## 2021-02-25 LAB — CBC
HCT: 42.2 % (ref 39.0–52.0)
Hemoglobin: 14.6 g/dL (ref 13.0–17.0)
MCH: 32 pg (ref 26.0–34.0)
MCHC: 34.6 g/dL (ref 30.0–36.0)
MCV: 92.5 fL (ref 80.0–100.0)
Platelets: 276 10*3/uL (ref 150–400)
RBC: 4.56 MIL/uL (ref 4.22–5.81)
RDW: 12.6 % (ref 11.5–15.5)
WBC: 11.9 10*3/uL — ABNORMAL HIGH (ref 4.0–10.5)
nRBC: 0 % (ref 0.0–0.2)

## 2021-02-25 LAB — COMPREHENSIVE METABOLIC PANEL
ALT: 17 U/L (ref 0–44)
AST: 25 U/L (ref 15–41)
Albumin: 4.3 g/dL (ref 3.5–5.0)
Alkaline Phosphatase: 68 U/L (ref 38–126)
Anion gap: 8 (ref 5–15)
BUN: 20 mg/dL (ref 6–20)
CO2: 29 mmol/L (ref 22–32)
Calcium: 8.9 mg/dL (ref 8.9–10.3)
Chloride: 101 mmol/L (ref 98–111)
Creatinine, Ser: 0.96 mg/dL (ref 0.61–1.24)
GFR, Estimated: >60 mL/min (ref >60)
Glucose, Bld: 125 mg/dL — ABNORMAL HIGH (ref 70–99)
Potassium: 3.2 mmol/L — ABNORMAL LOW (ref 3.5–5.1)
Sodium: 138 mmol/L (ref 135–145)
Total Bilirubin: 0.6 mg/dL (ref 0.3–1.2)
Total Protein: 7.5 g/dL (ref 6.5–8.1)

## 2021-02-25 LAB — LIPASE, BLOOD: Lipase: 28 U/L (ref 11–51)

## 2021-02-25 LAB — ETHANOL: Alcohol, Ethyl (B): <10 mg/dL (ref <10)

## 2021-02-25 MED ORDER — SODIUM CHLORIDE 0.9 % IV SOLN
25.0000 mg | Freq: Once | INTRAVENOUS | Status: AC
  Administered 2021-02-25: 25 mg via INTRAVENOUS
  Filled 2021-02-25: qty 1

## 2021-02-25 MED ORDER — SODIUM CHLORIDE 0.9 % IV SOLN
INTRAVENOUS | Status: DC | PRN
  Administered 2021-02-25: 1000 mL via INTRAVENOUS

## 2021-02-25 MED ORDER — FAMOTIDINE IN NACL 20-0.9 MG/50ML-% IV SOLN
20.0000 mg | Freq: Once | INTRAVENOUS | Status: AC
  Administered 2021-02-25: 20 mg via INTRAVENOUS
  Filled 2021-02-25: qty 50

## 2021-02-25 MED ORDER — SODIUM CHLORIDE 0.9 % IV BOLUS
1000.0000 mL | Freq: Once | INTRAVENOUS | Status: AC
  Administered 2021-02-25: 1000 mL via INTRAVENOUS

## 2021-02-25 MED ORDER — PROMETHAZINE HCL 25 MG/ML IJ SOLN
INTRAMUSCULAR | Status: AC
  Filled 2021-02-25: qty 1
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
## 2021-02-25 NOTE — ED Notes (Signed)
Pt aware of need for urine sample.  

## 2021-02-25 NOTE — ED Notes (Signed)
ED Provider at bedside. 

## 2021-02-25 NOTE — ED Triage Notes (Signed)
Pt c/o n/v x ~1 year-worse x 3-4 days-denies pain-NAD-steady gait

## 2021-02-25 NOTE — ED Notes (Addendum)
Pt took out IV and left ED. Stopped pt in hallway and pt said he had a family emergency to attend to. Pt previously reported his nausea was decreased and he felt better. Pt alert and ambulatory with steady gait. Pt did not stay to get signature.

## 2021-02-26 ENCOUNTER — Telehealth: Payer: Self-pay

## 2021-02-26 MED ORDER — ONDANSETRON HCL 8 MG PO TABS: 8.0000 mg | ORAL_TABLET | Freq: Three times a day (TID) | ORAL | 0 refills | Status: DC | PRN

## 2021-02-26 NOTE — Telephone Encounter (Signed)
Pt advised rx sent in. 

## 2021-02-26 NOTE — Telephone Encounter (Signed)
Patient went to ED yesterday.  He is requesting prescription for zofran for nausea.  Patient can be reached at [phone].  [facility] [address]

## 2021-02-26 NOTE — Telephone Encounter (Signed)
I reviewed ED encounter records. I sent in zofran.

## 2021-02-26 NOTE — Telephone Encounter (Signed)
Please review and advise.

## 2021-03-02 ENCOUNTER — Ambulatory Visit (AMBULATORY_SURGERY_CENTER): Payer: Medicare Other | Admitting: *Deleted

## 2021-03-02 ENCOUNTER — Other Ambulatory Visit: Payer: Self-pay

## 2021-03-02 VITALS — Ht 68.0 in | Wt 169.0 lb

## 2021-03-02 DIAGNOSIS — Z1211 Encounter for screening for malignant neoplasm of colon: Secondary | ICD-10-CM

## 2021-03-02 DIAGNOSIS — A048 Other specified bacterial intestinal infections: Secondary | ICD-10-CM

## 2021-03-02 DIAGNOSIS — R1013 Epigastric pain: Secondary | ICD-10-CM

## 2021-03-02 MED ORDER — NA SULFATE-K SULFATE-MG SULF 17.5-3.13-1.6 GM/177ML PO SOLN: 1.0000 | ORAL | 0 refills | Status: DC

## 2021-03-02 NOTE — Progress Notes (Signed)
Patient's pre-visit was done today over the phone with the patient due to COVID-19 pandemic. Name,DOB and address verified. Patient denies any allergies to Eggs and Soy. Patient denies any problems with anesthesia/sedation. Patient is not taking any diet pills or blood thinners. No home Oxygen. Packet of Prep instructions mailed to patient including a copy of a consent form-pt is aware. Patient understands to call us back with any questions or concerns. Patient is aware of our care-partner policy and [id number] safety protocol.

## 2021-03-17 ENCOUNTER — Ambulatory Visit (AMBULATORY_SURGERY_CENTER): Payer: Medicare Other | Admitting: Gastroenterology

## 2021-03-17 ENCOUNTER — Other Ambulatory Visit: Payer: Self-pay

## 2021-03-17 ENCOUNTER — Encounter: Payer: Self-pay | Admitting: Gastroenterology

## 2021-03-17 VITALS — BP 107/72 | HR 69 | Temp 99.1°F | Resp 22 | Ht 68.0 in | Wt 169.0 lb

## 2021-03-17 DIAGNOSIS — D122 Benign neoplasm of ascending colon: Secondary | ICD-10-CM

## 2021-03-17 DIAGNOSIS — D123 Benign neoplasm of transverse colon: Secondary | ICD-10-CM | POA: Diagnosis not present

## 2021-03-17 DIAGNOSIS — K2970 Gastritis, unspecified, without bleeding: Secondary | ICD-10-CM

## 2021-03-17 DIAGNOSIS — D128 Benign neoplasm of rectum: Secondary | ICD-10-CM

## 2021-03-17 DIAGNOSIS — K2950 Unspecified chronic gastritis without bleeding: Secondary | ICD-10-CM | POA: Diagnosis not present

## 2021-03-17 DIAGNOSIS — A048 Other specified bacterial intestinal infections: Secondary | ICD-10-CM

## 2021-03-17 DIAGNOSIS — Z1211 Encounter for screening for malignant neoplasm of colon: Secondary | ICD-10-CM | POA: Diagnosis not present

## 2021-03-17 DIAGNOSIS — B9681 Helicobacter pylori [H. pylori] as the cause of diseases classified elsewhere: Secondary | ICD-10-CM | POA: Diagnosis not present

## 2021-03-17 MED ORDER — SODIUM CHLORIDE 0.9 % IV SOLN: 500.0000 mL | Freq: Once | INTRAVENOUS | Status: DC

## 2021-03-17 NOTE — Op Note (Signed)
[facility] Patient Name: Cody Conway Procedure Date: 03/17/2021 2:18 PM MRN: [id number] Endoscopist: Mallie Mussel L. Loletha Carrow , MD Age: 50 Referring MD:  Date of Birth: 11/11/1970 Gender: Male Account #: [id number] Procedure:                Upper GI endoscopy Indications:              Epigastric abdominal pain, Previously treated for                            Helicobacter pylori                           Self-limited upper abdominal pain a year ago. H                            pylori discovered at that time and patient took                            incomplete treatment course.                           Currently asymptomatic Medicines:                Monitored Anesthesia Care Procedure:                Pre-Anesthesia Assessment:                           - Prior to the procedure, a History and Physical                            was performed, and patient medications and                            allergies were reviewed. The patient's tolerance of                            previous anesthesia was also reviewed. The risks                            and benefits of the procedure and the sedation                            options and risks were discussed with the patient.                            All questions were answered, and informed consent                            was obtained. Prior Anticoagulants: The patient has                            taken no previous anticoagulant or antiplatelet  agents. ASA Grade Assessment: II - A patient with                            mild systemic disease. After reviewing the risks                            and benefits, the patient was deemed in                            satisfactory condition to undergo the procedure.                           After obtaining informed consent, the endoscope was                            passed under direct vision. Throughout the                            procedure, the  patient's blood pressure, pulse, and                            oxygen saturations were monitored continuously. The                            Endoscope was introduced through the mouth, and                            advanced to the second part of duodenum. The upper                            GI endoscopy was accomplished without difficulty.                            The patient tolerated the procedure well. Scope In: Scope Out: Findings:                 The larynx was normal.                           The esophagus was normal.                           The entire examined stomach was normal. Several                            biopsies were obtained on the greater curvature of                            the gastric body, on the lesser curvature of the                            gastric body, on the greater curvature of the  gastric antrum and on the lesser curvature of the                            gastric antrum with cold forceps for histology.                           The cardia and gastric fundus were normal on                            retroflexion.                           The examined duodenum was normal. Complications:            No immediate complications. Estimated Blood Loss:     Estimated blood loss was minimal. Impression:               - Normal larynx.                           - Normal esophagus.                           - Normal stomach.                           - Normal examined duodenum.                           - Several biopsies were obtained on the greater                            curvature of the gastric body, on the lesser                            curvature of the gastric body, on the greater                            curvature of the gastric antrum and on the lesser                            curvature of the gastric antrum.                           If H pylori still present, it appears to be                             incidental and asymptomatic. Recommendation:           - Patient has a contact number available for                            emergencies. The signs and symptoms of potential                            delayed complications were discussed with the  patient. Return to normal activities tomorrow.                            Written discharge instructions were provided to the                            patient.                           - Resume previous diet.                           - Continue present medications.                           - Await pathology results.                           - See the other procedure note for documentation of                            additional recommendations. Alston Berrie L. Loletha Carrow, MD 03/17/2021 2:59:30 PM This report has been signed electronically.

## 2021-03-17 NOTE — Progress Notes (Signed)
Pt's states no medical or surgical changes since previsit or office visit. 

## 2021-03-17 NOTE — Patient Instructions (Addendum)
Handout on polyps given to you today   YOU HAD AN ENDOSCOPIC PROCEDURE TODAY AT [facility]:   Refer to the procedure report that was given to you for any specific questions about what was found during the examination.  If the procedure report does not answer your questions, please call your gastroenterologist to clarify.  If you requested that your care partner not be given the details of your procedure findings, then the procedure report has been included in a sealed envelope for you to review at your convenience later.  YOU SHOULD EXPECT: Some feelings of bloating in the abdomen. Passage of more gas than usual.  Walking can help get rid of the air that was put into your GI tract during the procedure and reduce the bloating. If you had a lower endoscopy (such as a colonoscopy or flexible sigmoidoscopy) you may notice spotting of blood in your stool or on the toilet paper. If you underwent a bowel prep for your procedure, you may not have a normal bowel movement for a few days.  Please Note:  You might notice some irritation and congestion in your nose or some drainage.  This is from the oxygen used during your procedure.  There is no need for concern and it should clear up in a day or so.  SYMPTOMS TO REPORT IMMEDIATELY:  Following lower endoscopy (colonoscopy or flexible sigmoidoscopy):  Excessive amounts of blood in the stool  Significant tenderness or worsening of abdominal pains  Swelling of the abdomen that is new, acute  Fever of 100F or higher  Following upper endoscopy (EGD)  Vomiting of blood or coffee ground material  New chest pain or pain under the shoulder blades  Painful or persistently difficult swallowing  New shortness of breath  Fever of 100F or higher  Black, tarry-looking stools  For urgent or emergent issues, a gastroenterologist can be reached at any hour by calling [phone]. Do not use MyChart messaging for urgent concerns.    DIET:  We do  recommend a small meal at first, but then you may proceed to your regular diet.  Drink plenty of fluids but you should avoid alcoholic beverages for 24 hours.  ACTIVITY:  You should plan to take it easy for the rest of today and you should NOT DRIVE or use heavy machinery until tomorrow (because of the sedation medicines used during the test).    FOLLOW UP: Our staff will call the number listed on your records 48-72 hours following your procedure to check on you and address any questions or concerns that you may have regarding the information given to you following your procedure. If we do not reach you, we will leave a message.  We will attempt to reach you two times.  During this call, we will ask if you have developed any symptoms of COVID 19. If you develop any symptoms (ie: fever, flu-like symptoms, shortness of breath, cough etc.) before then, please call [phone].  If you test positive for Covid 19 in the 2 weeks post procedure, please call and report this information to [place].    If any biopsies were taken you will be contacted by phone or by letter within the next 1-3 weeks.  Please call us at [phone] if you have not heard about the biopsies in 3 weeks.    SIGNATURES/CONFIDENTIALITY: You and/or your care partner have signed paperwork which will be entered into your electronic medical record.  These signatures attest to the  fact that that the information above on your After Visit Summary has been reviewed and is understood.  Full responsibility of the confidentiality of this discharge information lies with you and/or your care-partner.
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
## 2021-03-17 NOTE — Progress Notes (Signed)
Pt Drowsy. VSS. To PACU, report to RN. No anesthetic complications noted. Bovie Site clear on right thigh

## 2021-03-17 NOTE — Progress Notes (Signed)
Called to room to assist during endoscopic procedure.  Patient ID and intended procedure confirmed with present staff. Received instructions for my participation in the procedure from the performing physician.  

## 2021-03-17 NOTE — Op Note (Signed)
[facility] Patient Name: Cody Conway Procedure Date: 03/17/2021 2:18 PM MRN: [id number] Endoscopist: Mallie Mussel L. Loletha Carrow , MD Age: 50 Referring MD:  Date of Birth: Aug 14, 1970 Gender: Male Account #: [id number] Procedure:                Colonoscopy Indications:              Screening for colorectal malignant neoplasm, This                            is the patient's first colonoscopy Medicines:                Monitored Anesthesia Care Procedure:                Pre-Anesthesia Assessment:                           - Prior to the procedure, a History and Physical                            was performed, and patient medications and                            allergies were reviewed. The patient's tolerance of                            previous anesthesia was also reviewed. The risks                            and benefits of the procedure and the sedation                            options and risks were discussed with the patient.                            All questions were answered, and informed consent                            was obtained. Prior Anticoagulants: The patient has                            taken no previous anticoagulant or antiplatelet                            agents. ASA Grade Assessment: II - A patient with                            mild systemic disease. After reviewing the risks                            and benefits, the patient was deemed in                            satisfactory condition to undergo the procedure.  After obtaining informed consent, the colonoscope                            was passed under direct vision. Throughout the                            procedure, the patient's blood pressure, pulse, and                            oxygen saturations were monitored continuously. The                            CF [id number] #[id number] was introduced through the anus                            and advanced to the the  cecum, identified by                            appendiceal orifice and ileocecal valve. The                            colonoscopy was performed without difficulty. The                            patient tolerated the procedure well. The quality                            of the bowel preparation was good. The ileocecal                            valve, appendiceal orifice, and rectum were                            photographed. Scope In: 2:31:23 PM Scope Out: 2:54:10 PM Scope Withdrawal Time: 0 hours 19 minutes 4 seconds  Total Procedure Duration: 0 hours 22 minutes 47 seconds  Findings:                 The perianal and digital rectal examinations were                            normal.                           Three semi-sessile polyps were found in the                            proximal transverse colon and ascending colon. The                            polyps were 4 to 10 mm in size. These polyps were                            removed with a cold snare. Resection and retrieval  were complete. (Jar 2)                           A 10 mm polyp was found in the proximal rectum. The                            polyp was semi-pedunculated. The polyp was removed                            with a hot snare. Resection and retrieval were                            complete. (Jar 3)                           A 4 mm polyp was found in the distal rectum. The                            polyp was sessile. The polyp was removed with a                            cold snare. Resection and retrieval were complete.                            (Jar 3) Complications:            No immediate complications. Estimated Blood Loss:     Estimated blood loss was minimal. Impression:               - Three 4 to 10 mm polyps in the proximal                            transverse colon and in the ascending colon,                            removed with a cold snare. Resected and  retrieved.                           - One 10 mm polyp in the proximal rectum, removed                            with a hot snare. Resected and retrieved.                           - One 4 mm polyp in the distal rectum, removed with                            a cold snare. Resected and retrieved. Recommendation:           - Patient has a contact number available for                            emergencies. The signs and symptoms of potential  delayed complications were discussed with the                            patient. Return to normal activities tomorrow.                            Written discharge instructions were provided to the                            patient.                           - Resume previous diet.                           - Continue present medications.                           - Await pathology results.                           - Repeat colonoscopy is recommended for                            surveillance. The colonoscopy date will be                            determined after pathology results from today's                            exam become available for review. Mikhael Hendriks L. Loletha Carrow, MD 03/17/2021 3:03:44 PM This report has been signed electronically.

## 2021-03-19 ENCOUNTER — Telehealth: Payer: Self-pay | Admitting: *Deleted

## 2021-03-19 NOTE — Telephone Encounter (Signed)
  Follow up Call-  Call back number 03/17/2021  Post procedure Call Back phone  # [phone]  Permission to leave phone message Yes  Some recent data might be hidden     Patient questions:  Do you have a fever, pain , or abdominal swelling? No. Pain Score  0 *  Have you tolerated food without any problems? Yes.    Have you been able to return to your normal activities? Yes.    Do you have any questions about your discharge instructions: Diet   No. Medications  No. Follow up visit  No.  Do you have questions or concerns about your Care? No.  Actions: * If pain score is 4 or above: No action needed, pain <4.   Have you developed a fever since your procedure? no  2.   Have you had an respiratory symptoms (SOB or cough) since your procedure? no  3.   Have you tested positive for COVID 19 since your procedure no  4.   Have you had any family members/close contacts diagnosed with the COVID 19 since your procedure?  no   If yes to any of these questions please route to Joylene John, RN and Joella Prince, RN

## 2021-03-23 ENCOUNTER — Encounter: Payer: Self-pay | Admitting: Gastroenterology

## 2021-03-24 ENCOUNTER — Other Ambulatory Visit: Payer: Self-pay

## 2021-03-24 ENCOUNTER — Telehealth: Payer: Self-pay | Admitting: Family Medicine

## 2021-03-24 DIAGNOSIS — A048 Other specified bacterial intestinal infections: Secondary | ICD-10-CM

## 2021-03-24 NOTE — Telephone Encounter (Signed)
Noted  

## 2021-03-24 NOTE — Telephone Encounter (Signed)
Pt calling wanting to speak to nurse, he just had colonoscopy done and he got a phone call today that said that the polups they removed were cancerous. He has some other things to ask about but wants to speak to the nurse, please advise, [phone] is his call back

## 2021-03-24 NOTE — Telephone Encounter (Signed)
Pt has appt with infectious disease scheduled for 03/25/21

## 2021-03-25 ENCOUNTER — Ambulatory Visit (INDEPENDENT_AMBULATORY_CARE_PROVIDER_SITE_OTHER): Payer: Medicare Other | Admitting: Internal Medicine

## 2021-03-25 ENCOUNTER — Other Ambulatory Visit: Payer: Self-pay

## 2021-03-25 ENCOUNTER — Encounter: Payer: Self-pay | Admitting: Internal Medicine

## 2021-03-25 DIAGNOSIS — A048 Other specified bacterial intestinal infections: Secondary | ICD-10-CM | POA: Diagnosis present

## 2021-03-25 DIAGNOSIS — Z880 Allergy status to penicillin: Secondary | ICD-10-CM

## 2021-03-25 MED ORDER — AMOXICILLIN 250 MG/5ML PO SUSR: 1000.0000 mg | Freq: Three times a day (TID) | ORAL | 0 refills | Status: AC

## 2021-03-25 MED ORDER — LANSOPRAZOLE 30 MG PO CPDR: 30.0000 mg | DELAYED_RELEASE_CAPSULE | Freq: Two times a day (BID) | ORAL | 0 refills | Status: DC

## 2021-03-25 NOTE — Assessment & Plan Note (Addendum)
Discussed with patient his penicillin allergy.  Does not sound like true allergy and he has tolerated amoxicillin in the past.  He is comfortable with trial of amoxicillin at this time.  Since he failed initial treatment with Bismuth quadruple therapy due to intolerance and he has some intolerance to other antibiotics, will try amoxicillin 1000mg  suspension TID and high dose PPI x 14 days; avoiding clarithromycin for now.  Will have him return in 6 weeks for eradication testing and advised to hold PPI and not eat/drink anything for 1-2 hours prior to breath testing.

## 2021-03-25 NOTE — Progress Notes (Signed)
[facility]  Reason for Consult: H pylori  Referring Provider: Dr Loletha Carrow  Chief Complaint  Patient presents with   New Patient (Initial Visit)    H. Pylori Infection - pt states that he is feeling better although having rib pain from throwing up  - nausea and vomiting has improved.      HPI:    Cody [facility] is a 50 y.o. male with PMHx as below who presents to the clinic for H. Pylori infection.   Patient has a history of GERD/dyspepsia/gastritis.  He was diagnosed with H pylori based on an IgG test in July 2021 and was prescribed treatment, but unable to complete due to antibiotics being too big and getting stuck in his throat.  He also had terrible nausea with Flagyl and was prescribed clarithromycin as well but this was not covered by insurance.  He was seen by GI in 02/2020 with some digestive symptoms in the setting of prior H pylori infection that was not completely treated and was supposed to have EGD 04/2020 but did not do so due to COVID pre-procedure testing that is required.   This was completed last month and Warthin Starry stains were positive for H. Pylori.  He is referred to our clinic due to penicillin allergy complicating his treatment options.  He is not currently on a PPI.   His initial treatment was Metronidazole, Clarithromycin, PPI, and Pepto Bismol.  He did not tolerate this especially nausea with Metronidazole.    He reports the penicillin allergy as a reaction about 30 years ago that he had some facial flushing similar to a sun burn when he took it.  He had no anaphylaxis or other issues.  He also reports having taken amoxicillin in the past with no issues.      Patient's Medications  New Prescriptions   AMOXICILLIN (AMOXIL) 250 MG/5ML SUSPENSION    Take 20 mLs (1,000 mg total) by mouth 3 (three) times daily for 14 days.  Previous Medications   ALPRAZOLAM (XANAX) 0.5 MG TABLET    Take 0.5 mg by mouth at bedtime as needed for  anxiety.   ONDANSETRON (ZOFRAN-ODT) 4 MG DISINTEGRATING TABLET    Take by mouth.   SILDENAFIL (REVATIO) 20 MG TABLET    TAKE 1 TO 5 TABS DAILY AS NEEDED  Modified Medications   Modified Medication Previous Medication   LANSOPRAZOLE (PREVACID) 30 MG CAPSULE lansoprazole (PREVACID) 30 MG capsule      Take 1 capsule (30 mg total) by mouth 2 (two) times daily before a meal for 14 days.    Take 30 mg by mouth daily at 12 noon.  Discontinued Medications   No medications on file      Past Medical History:  Diagnosis Date   ADHD (attention deficit hyperactivity disorder)    Anxiety    Panic with agoraphobia   Atypical chest pain    CXR normal here 10/2011, D-dimer negative.  Resolved with daily use of dexilant.   Cervical spondylosis    Plain film 11/2011   Chronic pain syndrome    low  back and neck   Deviated nasal septum    s/p nasal fracture   Erectile dysfunction    Helicobacter pylori gastritis 11/2019   Mood disorder (HCC)    bipolar vs depression   Tobacco dependence     Social History   Tobacco Use   Smoking status: Every Day    Packs/day: 1.00  Years: 28.00    Pack years: 28.00    Types: Cigarettes   Smokeless tobacco: Never  Vaping Use   Vaping Use: Never used  Substance Use Topics   Alcohol use: Not Currently   Drug use: Yes    Frequency: 7.0 times per week    Types: Marijuana    Comment: last used 03/17/2021    Family History  Problem Relation Age of Onset   Arthritis Mother    Breast cancer Mother    Arthritis Father    Anxiety disorder Sister    Colon cancer Neg Hx    Esophageal cancer Neg Hx    Stomach cancer Neg Hx     Allergies  Allergen Reactions   Sulfa Antibiotics Rash   Cephalexin    Hydrocodone    Morphine    Penicillins    Propoxyphene N-Acetaminophen    Tramadol Rash    Review of Systems  Constitutional: Negative.   Respiratory: Negative.    Cardiovascular: Negative.   Gastrointestinal:  Positive for nausea.   Musculoskeletal:  Positive for back pain.  All other systems reviewed and are negative.    OBJECTIVE:    Vitals:   03/25/21 0858  BP: 114/78  Pulse: 70  Resp: 16  Temp: 97.6 F (36.4 C)  TempSrc: Oral  SpO2: 99%  Weight: 163 lb 6.4 oz (74.1 kg)  Height: 5\' 7"  (1.702 m)     Body mass index is 25.59 kg/m.  Physical Exam Constitutional:      General: He is not in acute distress.    Appearance: Normal appearance.  HENT:     Head: Normocephalic and atraumatic.  Pulmonary:     Effort: Pulmonary effort is normal. No respiratory distress.  Abdominal:     General: There is no distension.     Palpations: Abdomen is soft.  Neurological:     General: No focal deficit present.     Mental Status: He is alert and oriented to person, place, and time.  Psychiatric:        Mood and Affect: Mood normal.        Behavior: Behavior normal.     Labs and Microbiology:  CBC Latest Ref Rng & Units 02/25/2021 12/20/2019 12/05/2019  WBC 4.0 - 10.5 K/uL 11.9(H) 6.0 9.8  Hemoglobin 13.0 - 17.0 g/dL 14.6 15.8 16.4  Hematocrit 39.0 - 52.0 % 42.2 45.6 47.3  Platelets 150 - 400 K/uL 276 297 318.0   CMP Latest Ref Rng & Units 02/25/2021 12/20/2019 12/05/2019  Glucose 70 - 99 mg/dL 125(H) 104(H) 110(H)  BUN 6 - 20 mg/dL 20 14 18   Creatinine 0.61 - 1.24 mg/dL 0.96 0.89 0.88  Sodium 135 - 145 mmol/L 138 140 140  Potassium 3.5 - 5.1 mmol/L 3.2(L) 4.8 3.9  Chloride 98 - 111 mmol/L 101 106 101  CO2 22 - 32 mmol/L 29 24 31   Calcium 8.9 - 10.3 mg/dL 8.9 8.7(L) 9.2  Total Protein 6.5 - 8.1 g/dL 7.5 - 7.2  Total Bilirubin 0.3 - 1.2 mg/dL 0.6 - 1.3(H)  Alkaline Phos 38 - 126 U/L 68 - 78  AST 15 - 41 U/L 25 - 17  ALT 0 - 44 U/L 17 - 11       ASSESSMENT & PLAN:    H. pylori infection Discussed with patient his penicillin allergy.  Does not sound like true allergy and he has tolerated amoxicillin in the past.  He is comfortable with trial of amoxicillin at this time.  Since  he failed initial  treatment with Bismuth quadruple therapy due to intolerance and he has some intolerance to other antibiotics, will try amoxicillin 1000mg  suspension TID and high dose PPI x 14 days; avoiding clarithromycin for now.  Will have him return in 6 weeks for eradication testing and advised to hold PPI and not eat/drink anything for 1-2 hours prior to breath testing.     Raynelle Highland for Infectious Disease [facility] 03/25/2021, 9:43 AM   I spent 60 minutes dedicated to the care of this patient on the date of this encounter to include pre-visit review of records, face-to-face time with the patient discussing H pylori, GERD, and PCN allergy, and post-visit ordering of testing.

## 2021-03-25 NOTE — Patient Instructions (Signed)
Thank you for coming to see me today. It was a pleasure seeing you.  To Do: We will do the following regimen for your H pylori infection: Prevacid 30mg  twice per day for 14 days Amoxicillin 1000mg  three times per day for 14 days After finishing the 14 day treatment you can resume taking Prevacid once per day  Follow up with me in about 6 weeks.  STOP taking your Prevacid about 1-2 weeks prior to your appointment with me When you come to your next visit, please do not eat or drink anything for about 1 hour so it does not interfere with the breath test for H pylori eradication.  If you have any questions or concerns, please do not hesitate to call the office at [phone].  Take Care,   Jule Ser

## 2021-04-14 ENCOUNTER — Telehealth: Payer: Self-pay

## 2021-04-14 NOTE — Telephone Encounter (Signed)
Patient called, says he lost his paperwork he was given after his last appointment with Dr. Juleen China and does not remember the instructions he was given. RN read his discharge instructions that were printed on his AVS and sent him activation code to set up MyChart so that he can access it himself for reference.    From 03/25/21 AVS:  "To Do: We will do the following regimen for your H pylori infection: Prevacid 30mg  twice per day for 14 days Amoxicillin 1000mg  three times per day for 14 days After finishing the 14 day treatment you can resume taking Prevacid once per day  Follow up with me in about 6 weeks.  STOP taking your Prevacid about 1-2 weeks prior to your appointment with me When you come to your next visit, please do not eat or drink anything for about 1 hour so it does not interfere with the breath test for H pylori eradication."  Beryle Flock, RN

## 2021-04-28 IMAGING — CT CT ABD-PELV W/ CM
2 of 5 series · 15 of 46 positions shown, 17 images · IV contrast (APPLIED)
Comparison: None.

CLINICAL DATA: Acute abdominal pain negative CT abdomen. No cause
for acute abdominal pain identified

EXAM:
CT ABDOMEN AND PELVIS WITH CONTRAST
TECHNIQUE: Multidetector CT imaging of the abdomen and pelvis was performed
using the standard protocol following bolus administration of
intravenous contrast.
CONTRAST:  100mL OMNIPAQUE IOHEXOL 300 MG/ML  SOLN

[Series 2: axial st · axial · 0.75mm/px · z∈[-554,-124]mm · 12 of 96 slices shown, 14 images]
[im 5/96  soft-tissue]
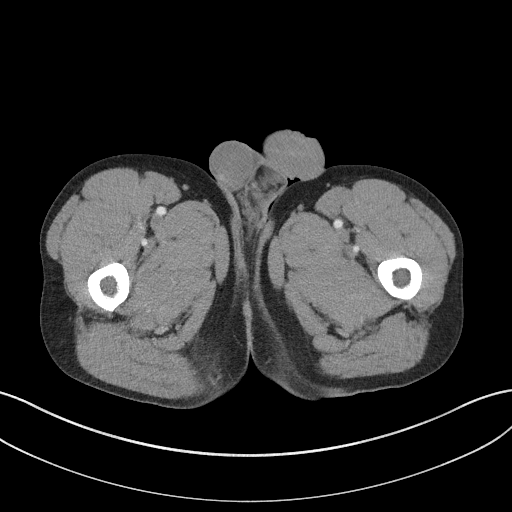
[im 5/96  bone]
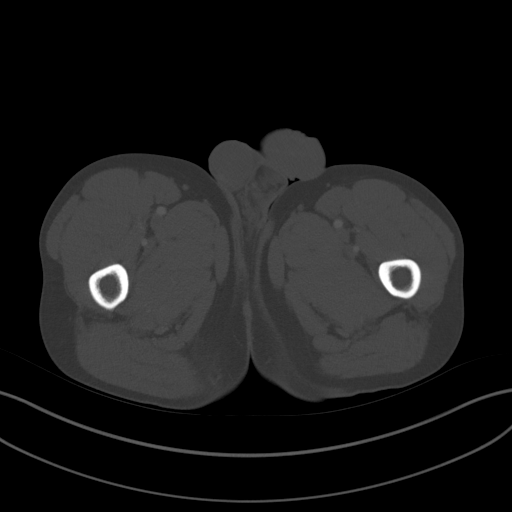
[im 15/96  soft-tissue]
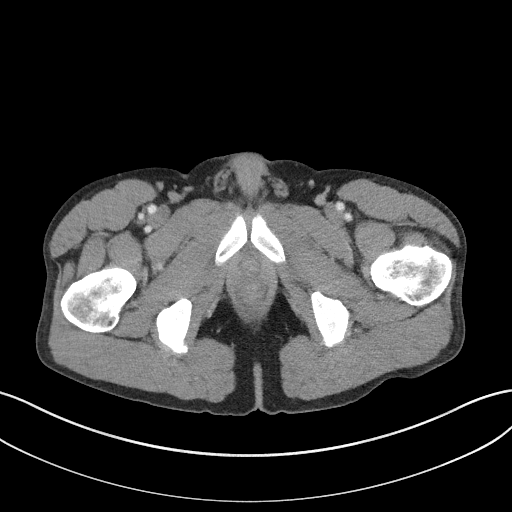
[im 20/96  soft-tissue]
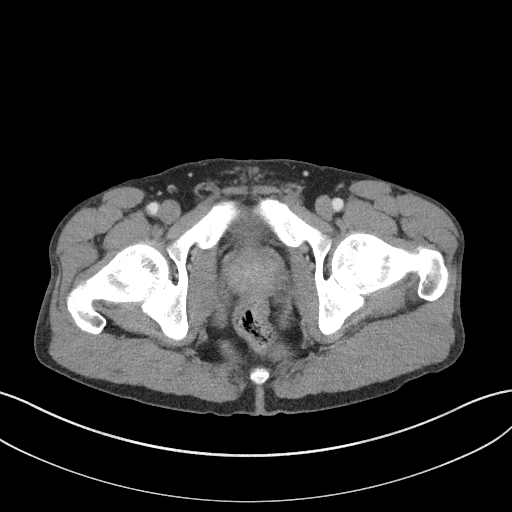
[im 29/96  soft-tissue]
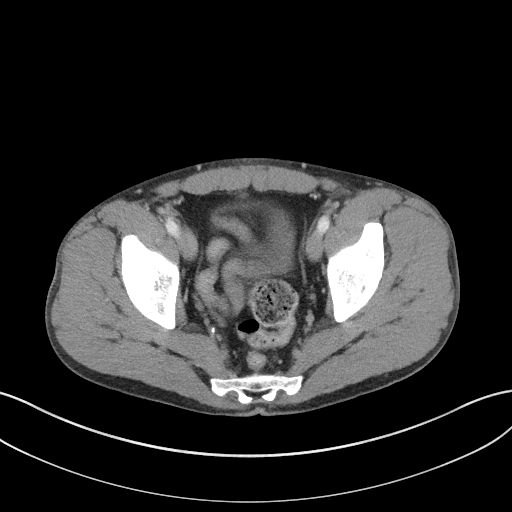
[im 39/96  soft-tissue]
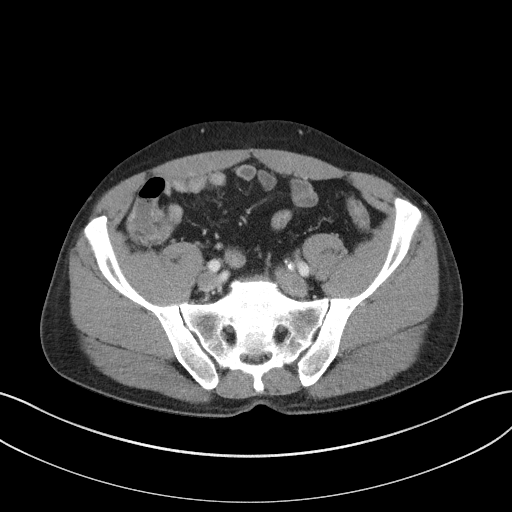
[im 43/96  soft-tissue]
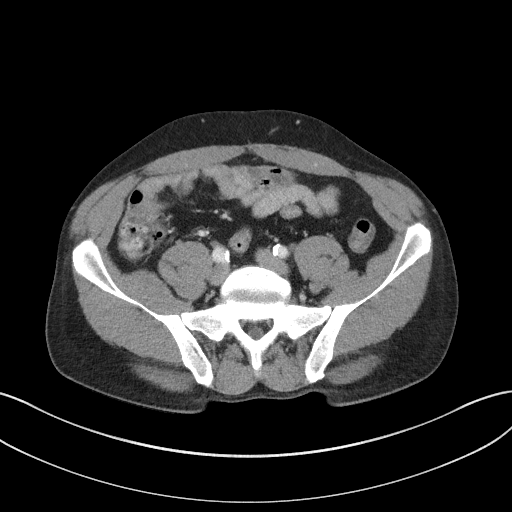
[im 53/96  soft-tissue]
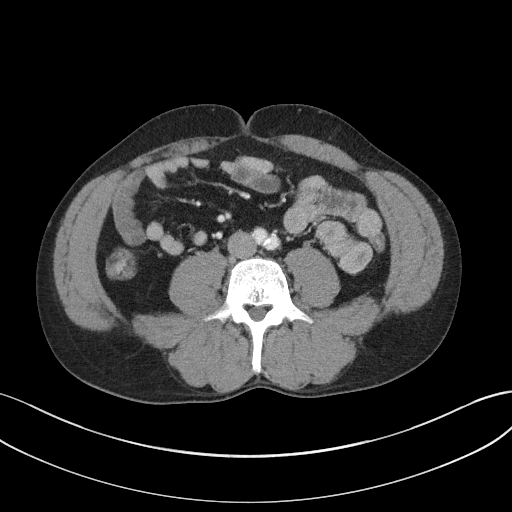
[im 58/96  soft-tissue]
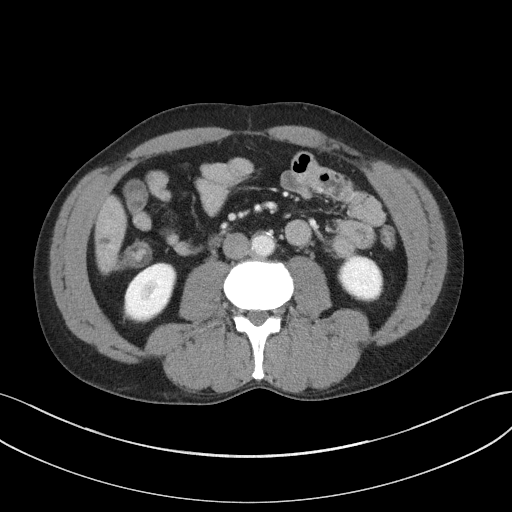
[im 67/96  soft-tissue]
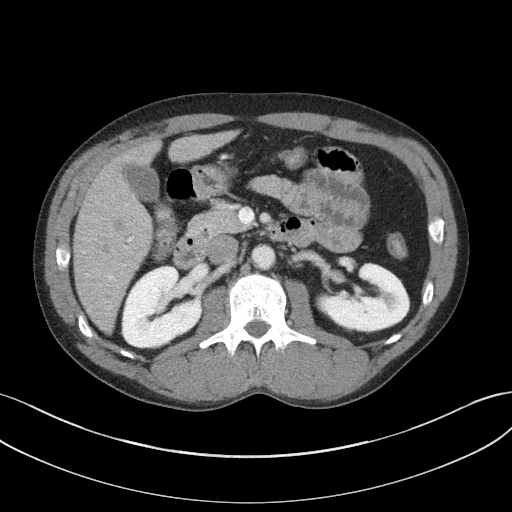
[im 67/96  bone]
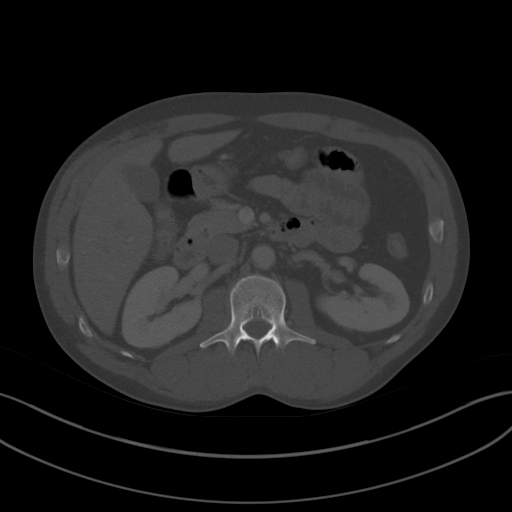
[im 77/96  soft-tissue]
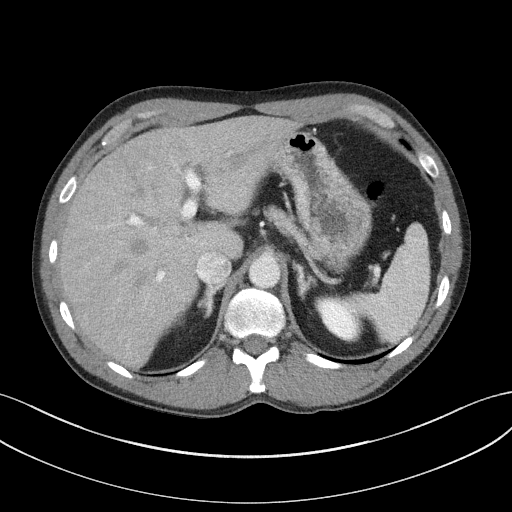
[im 81/96  soft-tissue]
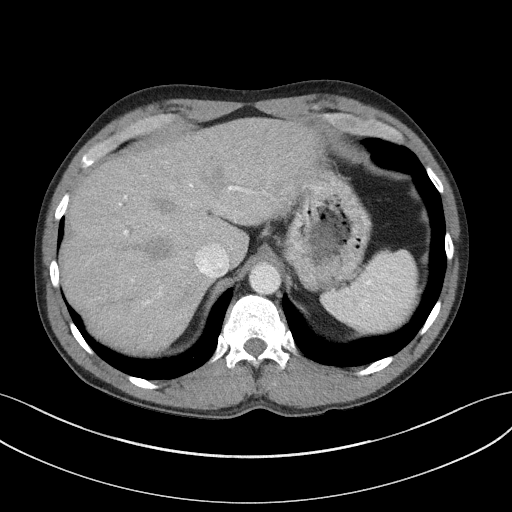
[im 91/96  soft-tissue]
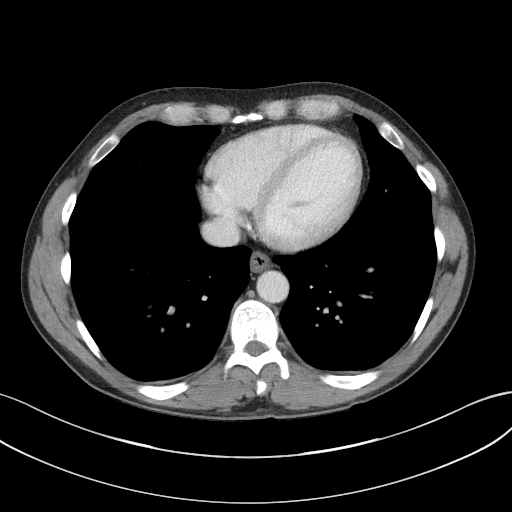

[Series 5: coronal st · coronal · 0.60mm/px · 3 of 80 slices shown]
[im 27/80  soft-tissue]
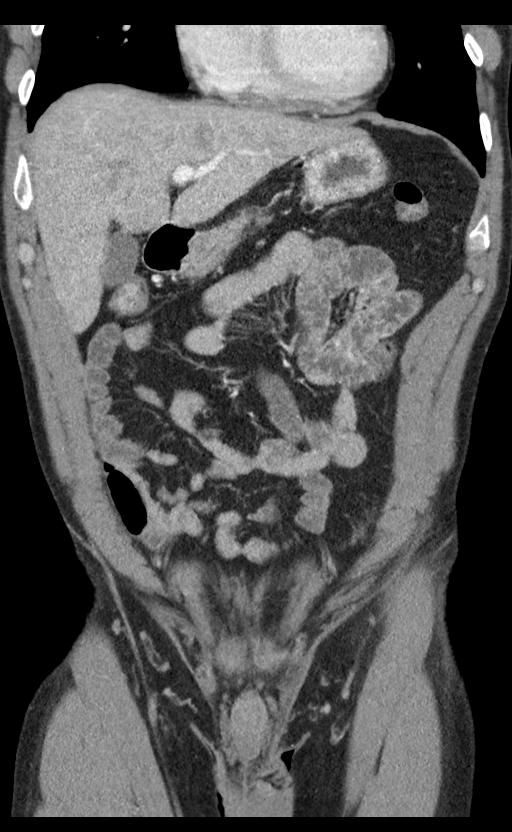
[im 36/80  soft-tissue]
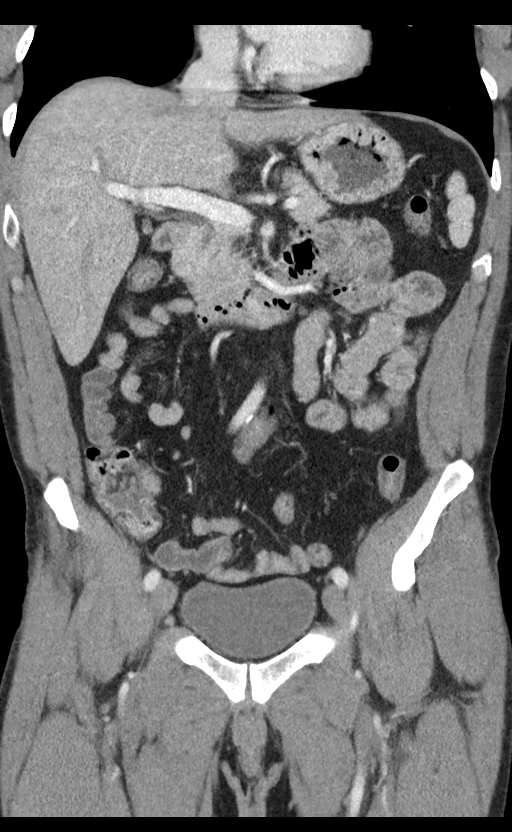
[im 44/80  soft-tissue]
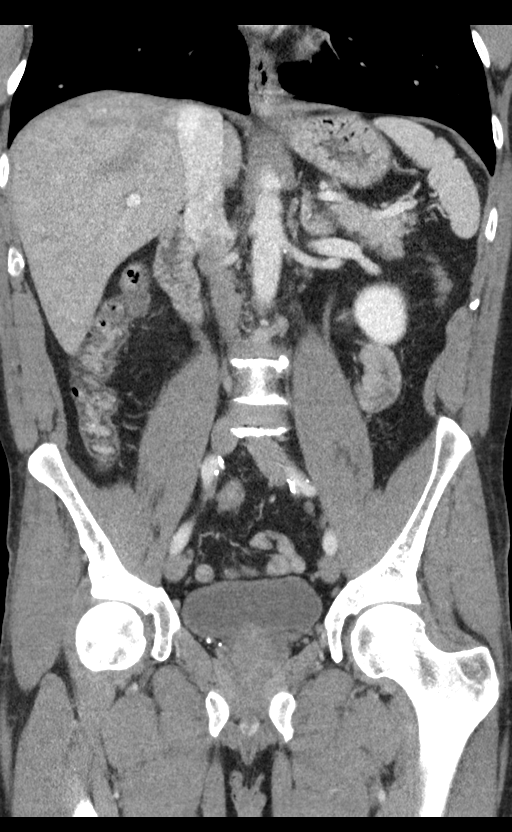

[15 of 46 positions shown; findings below may reference images not displayed]

FINDINGS: Lower chest: Lung bases clear bilaterally.

Hepatobiliary: 1 cm cyst in the caudal tip of the liver on the
right. Otherwise normal liver gallbladder and bile ducts.

Pancreas: Negative

Spleen: Negative

Adrenals/Urinary Tract: Adrenal glands are unremarkable. Kidneys are
normal, without renal calculi, focal lesion, or hydronephrosis.
Bladder is unremarkable.

Stomach/Bowel: Stomach is within normal limits. Appendix appears
normal. No evidence of bowel wall thickening, distention, or
inflammatory changes.

Vascular/Lymphatic: Atherosclerotic aorta and iliacs. No aneurysm.
No lymphadenopathy

Reproductive: Mild prostate enlargement.

Other: No free fluid.  Negative for hernia

Musculoskeletal: Negative
IMPRESSION: None

## 2021-05-06 ENCOUNTER — Other Ambulatory Visit: Payer: Self-pay

## 2021-05-06 ENCOUNTER — Ambulatory Visit (INDEPENDENT_AMBULATORY_CARE_PROVIDER_SITE_OTHER): Payer: Medicare Other | Admitting: Internal Medicine

## 2021-05-06 ENCOUNTER — Encounter: Payer: Self-pay | Admitting: Internal Medicine

## 2021-05-06 VITALS — BP 103/58 | HR 80 | Temp 99.5°F | Wt 158.0 lb

## 2021-05-06 DIAGNOSIS — Z880 Allergy status to penicillin: Secondary | ICD-10-CM

## 2021-05-06 DIAGNOSIS — A048 Other specified bacterial intestinal infections: Secondary | ICD-10-CM

## 2021-05-06 NOTE — Patient Instructions (Signed)
I will let you know the results of your H pylori breath tests and the next steps once I have these results.

## 2021-05-06 NOTE — Assessment & Plan Note (Signed)
He tolerated amoxicillin without issues.  Will remove from allergy list.

## 2021-05-06 NOTE — Progress Notes (Signed)
[facility]  CHIEF COMPLAINT:    Follow up for H pylori infection  SUBJECTIVE:    Cody [facility] is a 50 y.o. male with PMHx as below who presents to the clinic for H pylori infection.   Patient here today for routine follow up.  He was seen for H pylori on 03/25/21.  He completed 14 days of Amoxicillin 1000mg  TID and high dose PPI with Prevacid 30mg  BID.  He thinks he also took Prevacid 3 times per day but is not sure.  He has been off his PPI now for greater than 14 days because he ran out of the medication.  He has not had anything to eat or drink for over an hour.  He is here today for H pylori eradication testing. He reports nausea, cold sweats, and lack of appetite.  No fevers.   Please see A&P for the details of today's visit and status of the patient's medical problems.   Patient's Medications  New Prescriptions   No medications on file  Previous Medications   ALPRAZOLAM (XANAX) 0.5 MG TABLET    Take 0.5 mg by mouth at bedtime as needed for anxiety.   LANSOPRAZOLE (PREVACID) 30 MG CAPSULE    Take 1 capsule (30 mg total) by mouth 2 (two) times daily before a meal for 14 days.   ONDANSETRON (ZOFRAN-ODT) 4 MG DISINTEGRATING TABLET    Take by mouth.   SILDENAFIL (REVATIO) 20 MG TABLET    TAKE 1 TO 5 TABS DAILY AS NEEDED  Modified Medications   No medications on file  Discontinued Medications   No medications on file      Past Medical History:  Diagnosis Date   ADHD (attention deficit hyperactivity disorder)    Anxiety    Panic with agoraphobia   Atypical chest pain    CXR normal here 10/2011, D-dimer negative.  Resolved with daily use of dexilant.   Cervical spondylosis    Plain film 11/2011   Chronic pain syndrome    low  back and neck   Deviated nasal septum    s/p nasal fracture   Erectile dysfunction    Helicobacter pylori gastritis 11/2019   Mood disorder (HCC)    bipolar vs depression   Tobacco dependence     Social History    Tobacco Use   Smoking status: Every Day    Packs/day: 1.00    Years: 28.00    Pack years: 28.00    Types: Cigarettes   Smokeless tobacco: Never  Vaping Use   Vaping Use: Never used  Substance Use Topics   Alcohol use: Not Currently   Drug use: Yes    Frequency: 7.0 times per week    Types: Marijuana    Comment: last used 03/17/2021    Family History  Problem Relation Age of Onset   Arthritis Mother    Breast cancer Mother    Arthritis Father    Anxiety disorder Sister    Colon cancer Neg Hx    Esophageal cancer Neg Hx    Stomach cancer Neg Hx     Allergies  Allergen Reactions   Sulfa Antibiotics Rash   Cephalexin    Hydrocodone    Morphine    Propoxyphene N-Acetaminophen    Tramadol Rash    Review of Systems  All other systems reviewed and are negative. Except as noted above.   OBJECTIVE:    Vitals:   05/06/21 1556  BP: (!) 103/58  Pulse: 80  Temp: 99.5 F (37.5 C)  TempSrc: Temporal  SpO2: 96%  Weight: 158 lb (71.7 kg)   Body mass index is 24.75 kg/m.  Physical Exam Constitutional:      General: He is not in acute distress.    Appearance: Normal appearance.  HENT:     Head: Normocephalic and atraumatic.  Pulmonary:     Effort: Pulmonary effort is normal. No respiratory distress.  Skin:    General: Skin is warm and dry.  Neurological:     General: No focal deficit present.     Mental Status: He is alert and oriented to person, place, and time.  Psychiatric:        Mood and Affect: Mood normal.        Behavior: Behavior normal.     Labs and Microbiology: CBC Latest Ref Rng & Units 02/25/2021 12/20/2019 12/05/2019  WBC 4.0 - 10.5 K/uL 11.9(H) 6.0 9.8  Hemoglobin 13.0 - 17.0 g/dL 14.6 15.8 16.4  Hematocrit 39.0 - 52.0 % 42.2 45.6 47.3  Platelets 150 - 400 K/uL 276 297 318.0   CMP Latest Ref Rng & Units 02/25/2021 12/20/2019 12/05/2019  Glucose 70 - 99 mg/dL 125(H) 104(H) 110(H)  BUN 6 - 20 mg/dL 20 14 18   Creatinine 0.61 - 1.24 mg/dL  0.96 0.89 0.88  Sodium 135 - 145 mmol/L 138 140 140  Potassium 3.5 - 5.1 mmol/L 3.2(L) 4.8 3.9  Chloride 98 - 111 mmol/L 101 106 101  CO2 22 - 32 mmol/L 29 24 31   Calcium 8.9 - 10.3 mg/dL 8.9 8.7(L) 9.2  Total Protein 6.5 - 8.1 g/dL 7.5 - 7.2  Total Bilirubin 0.3 - 1.2 mg/dL 0.6 - 1.3(H)  Alkaline Phos 38 - 126 U/L 68 - 78  AST 15 - 41 U/L 25 - 17  ALT 0 - 44 U/L 17 - 11       ASSESSMENT & PLAN:    H. pylori infection Will check H pylori breath test today to confirm eradication of H pylori.  Follow up as needed based on results.    Penicillin allergy He tolerated amoxicillin without issues.  Will remove from allergy list.    Orders Placed This Encounter  Procedures   H. pylori breath test        Raynelle Highland for Infectious Disease [facility] 05/06/2021, 4:15 PM

## 2021-05-06 NOTE — Assessment & Plan Note (Signed)
Will check H pylori breath test today to confirm eradication of H pylori.  Follow up as needed based on results.

## 2021-05-07 ENCOUNTER — Other Ambulatory Visit: Payer: Self-pay | Admitting: Internal Medicine

## 2021-05-07 ENCOUNTER — Emergency Department (HOSPITAL_BASED_OUTPATIENT_CLINIC_OR_DEPARTMENT_OTHER)
Admission: EM | Admit: 2021-05-07 | Discharge: 2021-05-07 | Disposition: A | Discharge status: Home / Self Care | Payer: Medicare Other | Attending: Emergency Medicine | Admitting: Emergency Medicine

## 2021-05-07 DIAGNOSIS — Z5321 Procedure and treatment not carried out due to patient leaving prior to being seen by health care provider: Secondary | ICD-10-CM | POA: Insufficient documentation

## 2021-05-07 DIAGNOSIS — R0602 Shortness of breath: Secondary | ICD-10-CM | POA: Diagnosis present

## 2021-05-07 LAB — H. PYLORI BREATH TEST: H. pylori Breath Test: DETECTED — AB

## 2021-05-07 MED ORDER — RIFABUTIN 150 MG PO CAPS: 300.0000 mg | ORAL_CAPSULE | Freq: Every day | ORAL | 0 refills | Status: DC

## 2021-05-07 MED ORDER — PANTOPRAZOLE SODIUM 40 MG PO TBEC: 40.0000 mg | DELAYED_RELEASE_TABLET | Freq: Two times a day (BID) | ORAL | 0 refills | Status: DC

## 2021-05-07 MED ORDER — AMOXICILLIN 250 MG PO CAPS: 750.0000 mg | ORAL_CAPSULE | Freq: Three times a day (TID) | ORAL | 0 refills | Status: DC

## 2021-05-07 NOTE — Progress Notes (Signed)
Please let patient know that his H pylori eradication test was positive so we need to retreat him with a new regimen.  I have sent the following to his pharmacy: 1. Amoxicillin 750mg  three times per day 2. Rifabutin 300mg  once per day 3. Protonix 40mg  twice per day  This regimen will be for 14 days at which point he can go back and resume his standard PPI dose.  I would like to see him back in about 6 weeks (needs to be scheduled) after he completes this next round of treatment.    He should hold his PPI for about 1-2 weeks prior to his follow up with me since we will be doing eradication testing again.  He should not eat or drink for 1-2 hours prior to the appointment.  Thanks.

## 2021-05-07 NOTE — ED Triage Notes (Signed)
Pt to triage in w/c-yelling at staff that he can not breathe-no resp distress-not cooperating with VS-O2 sat reading 100% prior to pt taking probe off finger-explained to pt the need to get him checked in to ED-pt stood from w/c-yelling at staff-fast paced walk out of ED lobby

## 2021-05-08 ENCOUNTER — Other Ambulatory Visit: Payer: Self-pay | Admitting: Internal Medicine

## 2021-05-08 ENCOUNTER — Telehealth: Payer: Self-pay

## 2021-05-08 NOTE — Telephone Encounter (Signed)
-----   Message from Mignon Pine, DO sent at 05/07/2021  1:08 PM EST ----- Please let patient know that his H pylori eradication test was positive so we need to retreat him with a new regimen.  I have sent the following to his pharmacy: 1. Amoxicillin 750mg  three times per day 2. Rifabutin 300mg  once per day 3. Protonix 40mg  twice per day  This regimen will be for 14 days at which point he can go back and resume his standard PPI dose.  I would like to see him back in about 6 weeks (needs to be scheduled) after he completes this next round of treatment.    He should hold his PPI for about 1-2 weeks prior to his follow up with me since we will be doing eradication testing again.  He should not eat or drink for 1-2 hours prior to the appointment.  Thanks.

## 2021-05-08 NOTE — Telephone Encounter (Signed)
Patient aware of results. Patient stated that he is unable to keep anything down and if he takes any medications that it will need to be in liquid form. Patient stated that he went to the ED yesterday and ended up leaving due to wait time and he needs an IV due to being so dehydrated. I informed him I would let Dr.Wallace know. Please advise.   [place], CMA

## 2021-05-08 NOTE — Telephone Encounter (Signed)
Patient stated that he is going to the ED.

## 2021-05-08 NOTE — Telephone Encounter (Signed)
Informed patient there is no liquid form of medications that Dr.Wallace prescribed and a anti-nausea medication could be sent in. Patient stated that he already has anti-nausea medication and it isn't helping. He is unable to keep anything down. I advised patient that he should go to the ED.    [place], CMA

## 2021-05-20 ENCOUNTER — Other Ambulatory Visit (HOSPITAL_COMMUNITY): Payer: Self-pay

## 2021-05-20 ENCOUNTER — Telehealth: Payer: Self-pay

## 2021-05-20 ENCOUNTER — Other Ambulatory Visit: Payer: Self-pay

## 2021-05-20 DIAGNOSIS — A048 Other specified bacterial intestinal infections: Secondary | ICD-10-CM

## 2021-05-20 MED ORDER — PANTOPRAZOLE SODIUM 40 MG PO TBEC
40.0000 mg | DELAYED_RELEASE_TABLET | Freq: Two times a day (BID) | ORAL | 0 refills | Status: DC
  Filled 2021-05-20: qty 28, 14d supply, fill #0

## 2021-05-20 MED ORDER — RIFABUTIN 150 MG PO CAPS
300.0000 mg | ORAL_CAPSULE | Freq: Every day | ORAL | 0 refills | Status: AC
  Filled 2021-05-20: qty 28, 14d supply, fill #0

## 2021-05-20 MED ORDER — AMOXICILLIN 250 MG PO CAPS
750.0000 mg | ORAL_CAPSULE | Freq: Three times a day (TID) | ORAL | 0 refills | Status: AC
  Filled 2021-05-20: qty 126, 14d supply, fill #0

## 2021-05-20 NOTE — Telephone Encounter (Signed)
I spoke with patient and he is scheduled for a 6 week follow up. Medications have bee sent to [facility] to ensure he receives all 3 medication, so he can take them together.

## 2021-05-20 NOTE — Telephone Encounter (Signed)
-----   Message from Mignon Pine, DO sent at 05/19/2021  5:11 PM EST ----- Regarding: H pylori Hello -- Can you please reach out to patient regarding his nausea/vomiting symptoms and if they are better since going to the ED for cannabis hyperemesis?  If symptoms are better, was he able to start the antibiotic regimen I prescribed last time?  If not better, does he want me to look into a liquid regimen still?  If so, I will coordinate with pharmacy although the regimen I prescribed last visit would be preferable.   Thanks, Mitzi Hansen

## 2021-05-20 NOTE — Telephone Encounter (Signed)
I spoke to the patient and he stated he is feeling a little better. Patient stated he only received the amoxicillin and pantoprazole from [facility]. He received a notification from [facility] this morning stating the rifabutin is now ready for pick up but he has almost completed the amoxicillin and pantoprazole.   I spoke with [facility] this morning as well and the pharmacy was not able to give me a clear answer as to why the patient did not receive the full regimen H pylori treatment. I advised them all 3 medication were sent at the same time and patient should have been taking all 3 together.  Please advise on how you would like patient to proceed with treatment

## 2021-05-21 ENCOUNTER — Other Ambulatory Visit (HOSPITAL_COMMUNITY): Payer: Self-pay

## 2021-05-22 ENCOUNTER — Other Ambulatory Visit (HOSPITAL_COMMUNITY): Payer: Self-pay

## 2021-06-13 ENCOUNTER — Other Ambulatory Visit: Payer: Self-pay | Admitting: Internal Medicine

## 2021-06-13 DIAGNOSIS — A048 Other specified bacterial intestinal infections: Secondary | ICD-10-CM

## 2021-06-23 ENCOUNTER — Ambulatory Visit (INDEPENDENT_AMBULATORY_CARE_PROVIDER_SITE_OTHER): Payer: Medicare Other | Admitting: Internal Medicine

## 2021-06-23 ENCOUNTER — Other Ambulatory Visit: Payer: Self-pay

## 2021-06-23 ENCOUNTER — Encounter: Payer: Self-pay | Admitting: Internal Medicine

## 2021-06-23 VITALS — BP 119/78 | HR 84 | Temp 98.1°F | Wt 173.2 lb

## 2021-06-23 DIAGNOSIS — A048 Other specified bacterial intestinal infections: Secondary | ICD-10-CM

## 2021-06-23 NOTE — Progress Notes (Signed)
[facility]  CHIEF COMPLAINT:    Follow up for H. Pylori  Chief Complaint  Patient presents with   Follow-up     SUBJECTIVE:    Cody Conway is a 51 y.o. male with PMHx as below who presents to the clinic for H. pylori follow-up.   Patient presents for routine follow-up.  Cody Conway established care with our clinic in November 2022.  At that time Cody Conway was prescribed and reported that Cody Conway completed a course of amoxicillin 1 g 3 times daily and high-dose PPI with Prevacid 30 mg twice daily. His eradication testing for H. pylori on 05/06/2021 remained positive.    Cody Conway was subsequently prescribed amoxicillin 750 mg 3 times daily, rifabutin 300 mg daily, and Protonix 40 mg twice daily.  However, on 05/20/2021 Cody Conway reported having only received the amoxicillin and Protonix from his pharmacy.  It is unclear as to why Cody Conway was not prescribed the full regimen.  As a result, this 3 drug regimen was subsequently represcribed to [facility] on 05/20/2021 for 14 days and dispensed 05/22/21.  Cody Conway completed this course about 2 weeks ago.  Cody Conway reports feeling better today after completing this latest regimen.  Cody Conway is not having as much nausea or vomiting.  Cody Conway belches occasionally.  Cody Conway feels that Cody Conway does not need to take any PPI at this time.   Please see A&P for the details of today's visit and status of the patient's medical problems.   Patient's Medications  New Prescriptions   No medications on file  Previous Medications   ALPRAZOLAM (XANAX) 0.5 MG TABLET    Take 0.5 mg by mouth at bedtime as needed for anxiety.   ONDANSETRON (ZOFRAN-ODT) 4 MG DISINTEGRATING TABLET    Take by mouth.   PANTOPRAZOLE (PROTONIX) 40 MG TABLET    Take 1 tablet (40 mg total) by mouth 2 (two) times daily for 14 days.   SILDENAFIL (REVATIO) 20 MG TABLET    TAKE 1 TO 5 TABS DAILY AS NEEDED  Modified Medications   No medications on file  Discontinued Medications   No medications on file      Past Medical  History:  Diagnosis Date   ADHD (attention deficit hyperactivity disorder)    Anxiety    Panic with agoraphobia   Atypical chest pain    CXR normal here 10/2011, D-dimer negative.  Resolved with daily use of dexilant.   Cervical spondylosis    Plain film 11/2011   Chronic pain syndrome    low  back and neck   Deviated nasal septum    s/p nasal fracture   Erectile dysfunction    Helicobacter pylori gastritis 11/2019   Mood disorder (HCC)    bipolar vs depression   Tobacco dependence     Social History   Tobacco Use   Smoking status: Every Day    Packs/day: 1.00    Years: 28.00    Pack years: 28.00    Types: Cigarettes   Smokeless tobacco: Never  Vaping Use   Vaping Use: Never used  Substance Use Topics   Alcohol use: Not Currently   Drug use: Yes    Frequency: 7.0 times per week    Types: Marijuana    Comment: last used 03/17/2021    Family History  Problem Relation Age of Onset   Arthritis Mother    Breast cancer Mother    Arthritis Father    Anxiety disorder Sister  Colon cancer Neg Hx    Esophageal cancer Neg Hx    Stomach cancer Neg Hx     Allergies  Allergen Reactions   Sulfa Antibiotics Rash   Cephalexin    Hydrocodone    Morphine    Propoxyphene N-Acetaminophen    Tramadol Rash    Review of Systems  All other systems reviewed and are negative. Except as noted above.    OBJECTIVE:    Vitals:   06/23/21 1522  BP: 119/78  Pulse: 84  Temp: 98.1 F (36.7 C)  TempSrc: Temporal  SpO2: 96%  Weight: 173 lb 3.2 oz (78.6 kg)   Body mass index is 27.13 kg/m.  Physical Exam Constitutional:      General: Cody Conway is not in acute distress.    Appearance: Normal appearance.  HENT:     Head: Normocephalic and atraumatic.  Pulmonary:     Effort: Pulmonary effort is normal. No respiratory distress.  Abdominal:     General: There is no distension.     Palpations: Abdomen is soft.  Skin:    General: Skin is warm and dry.     Findings: No rash.   Neurological:     General: No focal deficit present.     Mental Status: Cody Conway is alert and oriented to person, place, and time.  Psychiatric:        Mood and Affect: Mood normal.        Behavior: Behavior normal.     Labs and Microbiology: CBC Latest Ref Rng & Units 02/25/2021 12/20/2019 12/05/2019  WBC 4.0 - 10.5 K/uL 11.9(H) 6.0 9.8  Hemoglobin 13.0 - 17.0 g/dL 14.6 15.8 16.4  Hematocrit 39.0 - 52.0 % 42.2 45.6 47.3  Platelets 150 - 400 K/uL 276 297 318.0   CMP Latest Ref Rng & Units 02/25/2021 12/20/2019 12/05/2019  Glucose 70 - 99 mg/dL 125(H) 104(H) 110(H)  BUN 6 - 20 mg/dL 20 14 18   Creatinine 0.61 - 1.24 mg/dL 0.96 0.89 0.88  Sodium 135 - 145 mmol/L 138 140 140  Potassium 3.5 - 5.1 mmol/L 3.2(L) 4.8 3.9  Chloride 98 - 111 mmol/L 101 106 101  CO2 22 - 32 mmol/L 29 24 31   Calcium 8.9 - 10.3 mg/dL 8.9 8.7(L) 9.2  Total Protein 6.5 - 8.1 g/dL 7.5 - 7.2  Total Bilirubin 0.3 - 1.2 mg/dL 0.6 - 1.3(H)  Alkaline Phos 38 - 126 U/L 68 - 78  AST 15 - 41 U/L 25 - 17  ALT 0 - 44 U/L 17 - 11       ASSESSMENT & PLAN:    H. pylori infection Discussed with patient that we need to perform eradication testing, however, the interval right now is too short from when Cody Conway finished his most current regimen.  Patient will return in 3-4 weeks for eradication testing with urea breath test.  Advised to hold PPI for 1-2 weeks prior to testing and not eat or drink anything a couple hours beforehand.  RTC as needed based on results of testing.   Orders Placed This Encounter  Procedures   H. pylori breath test    Standing Status:   Future    Standing Expiration Date:   06/23/2022       [facility] 06/23/2021, 3:40 PM

## 2021-06-23 NOTE — Assessment & Plan Note (Signed)
Discussed with patient that we need to perform eradication testing, however, the interval right now is too short from when he finished his most current regimen.  Patient will return in 3-4 weeks for eradication testing with urea breath test.  Advised to hold PPI for 1-2 weeks prior to testing and not eat or drink anything a couple hours beforehand.  RTC as needed based on results of testing.

## 2021-06-23 NOTE — Patient Instructions (Addendum)
Thank you for coming to see me today. It was a pleasure seeing you.  To Do: Return the week of February 13 or February 20 for your H pylori eradication test Do not eat or drink for a couple hours prior to the test Do not take any proton pump inhibitors for 1-2 weeks prior to your test Once I get the result I will let you know about next steps.  If you have any questions or concerns, please do not hesitate to call the office at [phone].  Take Care,   Jule Ser

## 2021-07-02 ENCOUNTER — Ambulatory Visit: Payer: Medicare Other | Admitting: Internal Medicine

## 2021-07-09 ENCOUNTER — Other Ambulatory Visit: Payer: Self-pay

## 2021-07-09 ENCOUNTER — Other Ambulatory Visit: Payer: Self-pay | Admitting: Family Medicine

## 2021-07-09 ENCOUNTER — Other Ambulatory Visit: Payer: Medicare Other

## 2021-07-09 DIAGNOSIS — A048 Other specified bacterial intestinal infections: Secondary | ICD-10-CM

## 2021-07-09 NOTE — Telephone Encounter (Signed)
Patient refill request.  I told patient request just now come thru --- He said he placed request from his pharmacy last week.

## 2021-07-10 ENCOUNTER — Telehealth: Payer: Self-pay

## 2021-07-10 IMAGING — DX DG CHEST 1V PORT
1 series · 1 of 1 positions shown · non-contrast
Comparison: 12/18/2011

CLINICAL DATA: Difficulty swallowing, choking on pills

EXAM:
PORTABLE CHEST 1 VIEW

[chest ap]
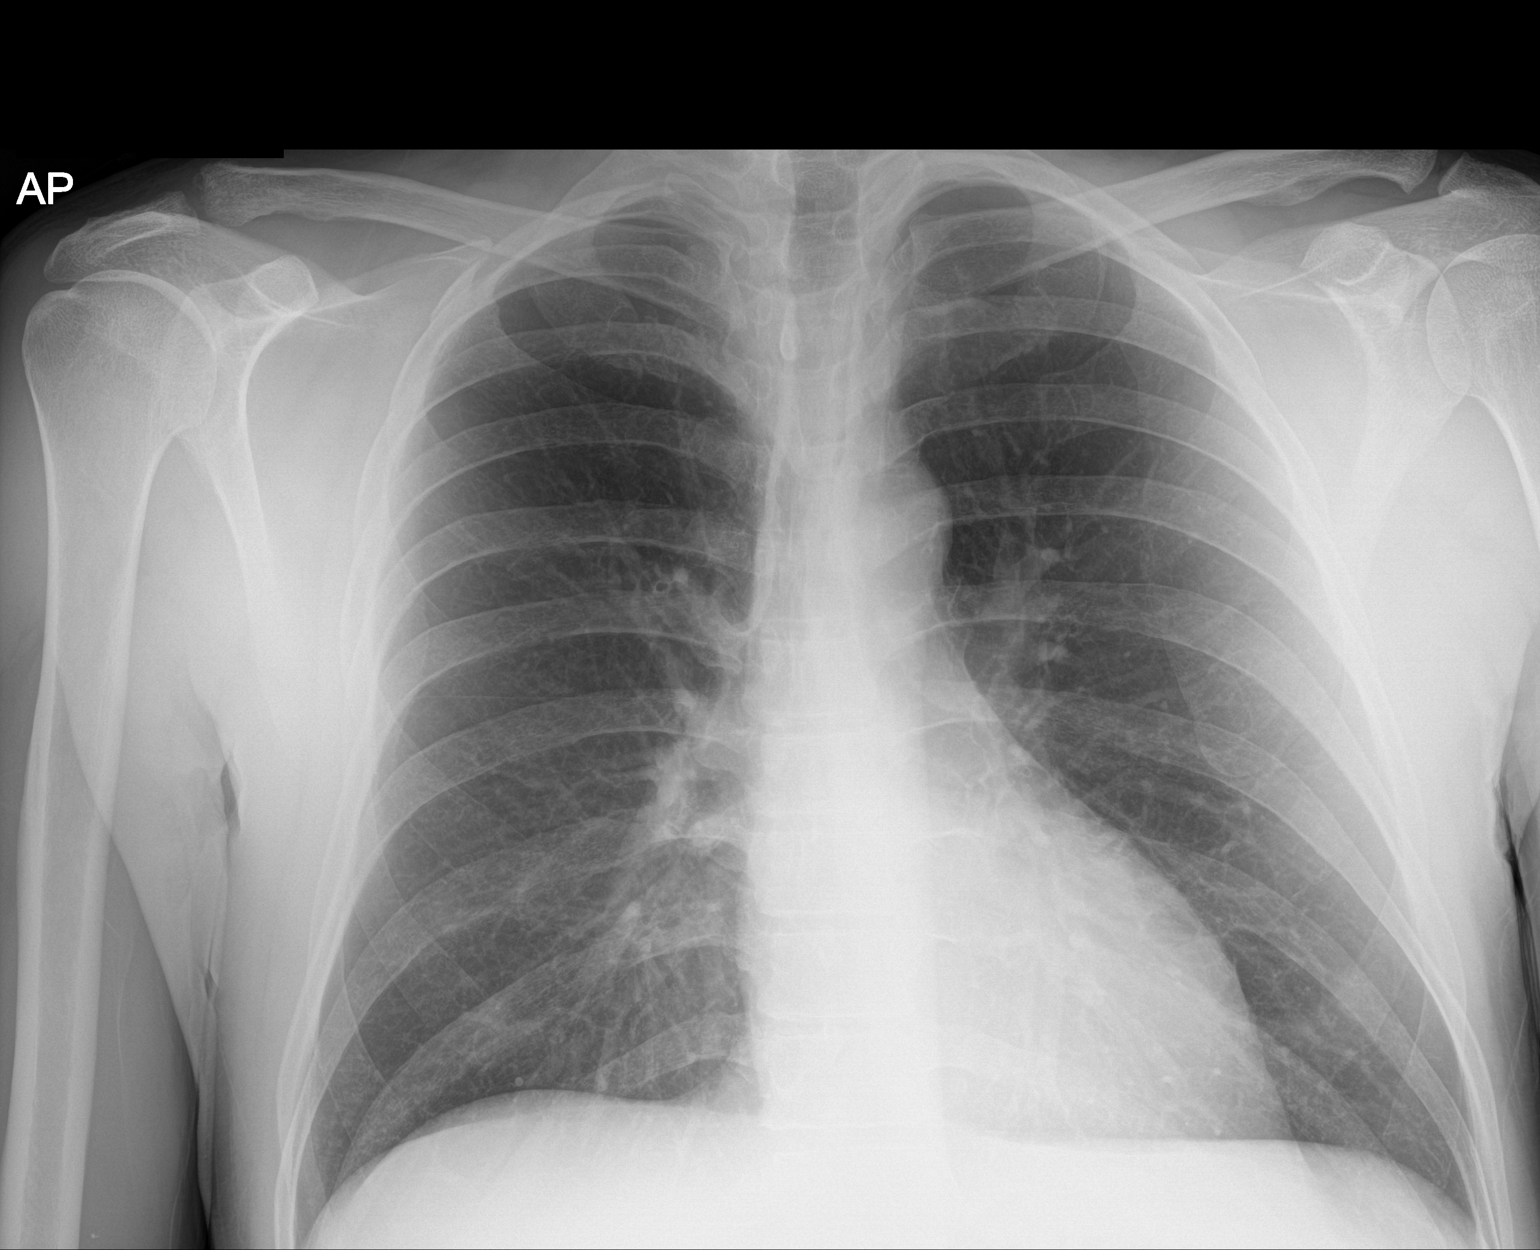

[1 of 1 positions shown; findings below may reference images not displayed]

FINDINGS: The heart size and mediastinal contours are within normal limits.
Both lungs are clear. The visualized skeletal structures are
unremarkable.
IMPRESSION: Normal study.

## 2021-07-10 NOTE — Telephone Encounter (Signed)
Pt advised appt needed. Pt agreed to be scheduled (3/1)

## 2021-07-10 NOTE — Telephone Encounter (Signed)
Pt requesting ibuprofen rx for arthritis.OC ibuprofen does not work well enough for him. I offered to schedule appt but he declined stating he did not need it.    Please review and advise

## 2021-07-10 NOTE — Telephone Encounter (Signed)
He does need appointment

## 2021-07-10 NOTE — Telephone Encounter (Signed)
Pt advised refill sent. °

## 2021-07-13 LAB — H. PYLORI BREATH TEST: H. pylori Breath Test: NOT DETECTED

## 2021-07-16 ENCOUNTER — Telehealth: Payer: Self-pay

## 2021-07-16 NOTE — Telephone Encounter (Signed)
Patient aware of results and voiced his understanding.  [place], CMA

## 2021-07-16 NOTE — Telephone Encounter (Signed)
-----   Message from Mignon Pine, DO sent at 07/16/2021  8:20 AM EST ----- Please let patient know that his H pylori eradication testing was negative.  No further ID follow up needed at this time.  Thanks

## 2021-07-22 ENCOUNTER — Ambulatory Visit: Payer: Medicare Other | Admitting: Family Medicine

## 2021-07-23 ENCOUNTER — Other Ambulatory Visit: Payer: Self-pay

## 2021-07-24 ENCOUNTER — Encounter: Payer: Self-pay | Admitting: Family Medicine

## 2021-07-24 ENCOUNTER — Ambulatory Visit (INDEPENDENT_AMBULATORY_CARE_PROVIDER_SITE_OTHER): Payer: Medicare Other | Admitting: Family Medicine

## 2021-07-24 VITALS — BP 126/81 | HR 77 | Temp 97.7°F | Ht 67.0 in | Wt 177.4 lb

## 2021-07-24 DIAGNOSIS — M545 Low back pain, unspecified: Secondary | ICD-10-CM | POA: Diagnosis not present

## 2021-07-24 DIAGNOSIS — K219 Gastro-esophageal reflux disease without esophagitis: Secondary | ICD-10-CM

## 2021-07-24 DIAGNOSIS — R1013 Epigastric pain: Secondary | ICD-10-CM

## 2021-07-24 DIAGNOSIS — G8929 Other chronic pain: Secondary | ICD-10-CM | POA: Diagnosis not present

## 2021-07-24 DIAGNOSIS — M47816 Spondylosis without myelopathy or radiculopathy, lumbar region: Secondary | ICD-10-CM | POA: Diagnosis not present

## 2021-07-24 MED ORDER — IBUPROFEN 800 MG PO TABS: 800.0000 mg | ORAL_TABLET | Freq: Three times a day (TID) | ORAL | 0 refills | Status: DC | PRN
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
## 2021-07-25 LAB — BASIC METABOLIC PANEL
BUN: 10 mg/dL (ref 7–25)
CO2: 25 mmol/L (ref 20–32)
Calcium: 9.2 mg/dL (ref 8.6–10.3)
Chloride: 103 mmol/L (ref 98–110)
Creat: 0.86 mg/dL (ref 0.70–1.30)
Glucose, Bld: 70 mg/dL (ref 65–99)
Potassium: 4.1 mmol/L (ref 3.5–5.3)
Sodium: 140 mmol/L (ref 135–146)

## 2021-08-20 ENCOUNTER — Ambulatory Visit: Payer: Medicare Other | Admitting: Rehabilitative and Restorative Service Providers"

## 2021-09-04 ENCOUNTER — Ambulatory Visit (INDEPENDENT_AMBULATORY_CARE_PROVIDER_SITE_OTHER): Payer: Medicare Other | Admitting: Family Medicine

## 2021-09-04 ENCOUNTER — Encounter: Payer: Self-pay | Admitting: Family Medicine

## 2021-09-04 VITALS — BP 138/82 | HR 74 | Temp 98.1°F | Ht 67.0 in | Wt 171.6 lb

## 2021-09-04 DIAGNOSIS — M545 Low back pain, unspecified: Secondary | ICD-10-CM | POA: Diagnosis not present

## 2021-09-04 DIAGNOSIS — M533 Sacrococcygeal disorders, not elsewhere classified: Secondary | ICD-10-CM | POA: Diagnosis not present

## 2021-09-04 DIAGNOSIS — G8929 Other chronic pain: Secondary | ICD-10-CM

## 2021-09-04 MED ORDER — IBUPROFEN 800 MG PO TABS: 800.0000 mg | ORAL_TABLET | Freq: Three times a day (TID) | ORAL | 1 refills | Status: DC | PRN

## 2021-09-04 MED ORDER — SILDENAFIL CITRATE 20 MG PO TABS: ORAL_TABLET | ORAL | 1 refills | Status: DC
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
## 2021-09-14 ENCOUNTER — Telehealth: Payer: Self-pay | Admitting: Family Medicine

## 2021-09-14 MED ORDER — SILDENAFIL CITRATE 20 MG PO TABS: ORAL_TABLET | ORAL | 6 refills | Status: DC

## 2021-09-14 NOTE — Telephone Encounter (Signed)
New sildenafil prescription sent ?

## 2021-09-14 NOTE — Telephone Encounter (Signed)
Pt called and states sildenafil was sent to the wrong pharmacy. He told [facility] he didn't want it due to cost. Please send to  ?[facility], [place] Phone:  [phone]  ?Fax:  [phone]  ?  ? ?

## 2021-09-14 NOTE — Telephone Encounter (Signed)
Pt was last seen 09/04/21, refill sent for sildenafil to [facility]. Pt would like rx resent to [facility]. ? ?Please review and advise. Med pending ?

## 2021-09-15 NOTE — Telephone Encounter (Signed)
Pt advised rx sent. ?

## 2021-10-09 LAB — CBC AND DIFFERENTIAL
HCT: 43 (ref 41–53)
Hemoglobin: 15.4 (ref 13.5–17.5)
Platelets: 276 10*3/uL (ref 150–400)
WBC: 10

## 2021-10-09 LAB — COMPREHENSIVE METABOLIC PANEL
Albumin: 4.6 (ref 3.5–5.0)
Calcium: 10.1 (ref 8.7–10.7)
Globulin: 3.1
eGFR: 85

## 2021-10-09 LAB — BASIC METABOLIC PANEL
BUN: 10 (ref 4–21)
CO2: 24 — AB (ref 13–22)
Chloride: 106 (ref 99–108)
Creatinine: 1.1 (ref 0.6–1.3)
Glucose: 169
Potassium: 4 mEq/L (ref 3.5–5.1)
Sodium: 142 (ref 137–147)

## 2021-10-09 LAB — HEPATIC FUNCTION PANEL
ALT: 17 U/L (ref 10–40)
AST: 24 (ref 14–40)

## 2021-10-09 LAB — CBC: RBC: 4.73 (ref 3.87–5.11)

## 2021-10-12 ENCOUNTER — Ambulatory Visit (INDEPENDENT_AMBULATORY_CARE_PROVIDER_SITE_OTHER): Payer: Medicare Other | Admitting: Family Medicine

## 2021-10-12 ENCOUNTER — Encounter: Payer: Self-pay | Admitting: Family Medicine

## 2021-10-12 VITALS — BP 130/78 | HR 58 | Temp 97.6°F | Ht 67.0 in | Wt 170.2 lb

## 2021-10-12 DIAGNOSIS — R1115 Cyclical vomiting syndrome unrelated to migraine: Secondary | ICD-10-CM

## 2021-10-12 DIAGNOSIS — R112 Nausea with vomiting, unspecified: Secondary | ICD-10-CM

## 2021-10-12 MED ORDER — AMITRIPTYLINE HCL 10 MG PO TABS
ORAL_TABLET | ORAL | 0 refills | Status: DC
Start: 2021-10-12 — End: 2021-10-30
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
## 2021-10-20 ENCOUNTER — Telehealth: Payer: Self-pay

## 2021-10-20 NOTE — Telephone Encounter (Signed)
Patient had to reschedule appt scheduled for tomorrow 531 with Dr. Anitra Lauth because of death in the family.  He rescheduled appt until  6/7. Patient states he was prescribed Amitriptyline, states he can take up to 2 pills.  He started out last week taking 1 pill. Patient wanted to let Dr. Anitra Lauth know that the 2 pills has been working and is it okay to continue to take 2 pills over the next week until his appt.  Patient states they were to discuss in his appt.  Please call [phone].

## 2021-10-20 NOTE — Telephone Encounter (Signed)
Spoke with pt regarding recommendations. ? ?

## 2021-10-20 NOTE — Telephone Encounter (Signed)
Mentioned in last OV note, started trial of amitriptyline 10 mg, 1 nightly and will reevaluate in 1 week. Current sig of rx states 2 po qhs.   Please review and advise.

## 2021-10-20 NOTE — Telephone Encounter (Signed)
Yes, 2 tabs daily is good.

## 2021-10-21 ENCOUNTER — Ambulatory Visit: Payer: Medicare Other | Admitting: Family Medicine

## 2021-10-28 ENCOUNTER — Ambulatory Visit: Payer: Medicare Other | Admitting: Family Medicine
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
## 2021-10-30 ENCOUNTER — Ambulatory Visit (INDEPENDENT_AMBULATORY_CARE_PROVIDER_SITE_OTHER): Payer: Medicare Other | Admitting: Family Medicine

## 2021-10-30 ENCOUNTER — Encounter: Payer: Self-pay | Admitting: Family Medicine

## 2021-10-30 VITALS — BP 126/86 | HR 69 | Temp 97.7°F | Wt 167.4 lb

## 2021-10-30 DIAGNOSIS — R1115 Cyclical vomiting syndrome unrelated to migraine: Secondary | ICD-10-CM

## 2021-10-30 MED ORDER — AMITRIPTYLINE HCL 50 MG PO TABS: 50.0000 mg | ORAL_TABLET | Freq: Every day | ORAL | 1 refills | Status: DC
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
## 2021-11-02 ENCOUNTER — Telehealth: Payer: Medicare Other | Admitting: Family Medicine

## 2021-11-02 DIAGNOSIS — G8929 Other chronic pain: Secondary | ICD-10-CM

## 2021-11-02 DIAGNOSIS — M542 Cervicalgia: Secondary | ICD-10-CM

## 2021-11-02 NOTE — Telephone Encounter (Signed)
Pt was seen on 4/14 for back pain. Referral was done for PT but nothing else.  Please review and advise
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
## 2021-11-02 NOTE — Telephone Encounter (Signed)
Okay, chiropractor referral ordered

## 2021-11-02 NOTE — Telephone Encounter (Signed)
Pt advised referral sent. 

## 2021-11-09 NOTE — Telephone Encounter (Signed)
Patient called to check status of referral to chiropractic office.  He has a name of chiropractic office he would like to go.  Can we send referral and any office notes to this office per his request?  [address], [place], [place] [address] [phone]  Thank you, Hinton Dyer

## 2021-11-20 NOTE — Telephone Encounter (Signed)
Form placed on PCP desk for review

## 2021-11-20 NOTE — Telephone Encounter (Signed)
Patient was seen by Chiropractor  - Juan Quam, [place] this week.  He is recommending for patient to have a couple of xrays.  Patient brought form in that included xrays he is requesting.  Form given to [facility] to share with Dr. Anitra Lauth.  He is requesting for Dr. Anitra Lauth to submit orders, patient will have xrays done at [facility].  Please call patient next week when orders are entered. Autrey can be reached at [phone].  Juan Quam, [facility] [place], [place], [place] [address] [phone]

## 2021-11-23 NOTE — Telephone Encounter (Signed)
Ok, x-rays ordered

## 2021-11-23 NOTE — Telephone Encounter (Signed)
Pt advised of orders placed.

## 2021-11-26 ENCOUNTER — Ambulatory Visit (HOSPITAL_BASED_OUTPATIENT_CLINIC_OR_DEPARTMENT_OTHER)
Admission: RE | Admit: 2021-11-26 | Discharge: 2021-11-26 | Disposition: A | Discharge status: Home / Self Care | Payer: Medicare Other | Source: Ambulatory Visit | Attending: Family Medicine | Admitting: Family Medicine

## 2021-11-26 DIAGNOSIS — G8929 Other chronic pain: Secondary | ICD-10-CM | POA: Diagnosis present

## 2021-11-26 DIAGNOSIS — M533 Sacrococcygeal disorders, not elsewhere classified: Secondary | ICD-10-CM | POA: Insufficient documentation

## 2021-11-26 DIAGNOSIS — M542 Cervicalgia: Secondary | ICD-10-CM | POA: Diagnosis present

## 2021-11-26 DIAGNOSIS — M545 Low back pain, unspecified: Secondary | ICD-10-CM | POA: Insufficient documentation

## 2021-11-27 ENCOUNTER — Encounter: Payer: Self-pay | Admitting: Family Medicine

## 2021-12-30 ENCOUNTER — Ambulatory Visit (INDEPENDENT_AMBULATORY_CARE_PROVIDER_SITE_OTHER): Payer: Medicare Other

## 2021-12-30 DIAGNOSIS — Z0000 Encounter for general adult medical examination without abnormal findings: Secondary | ICD-10-CM | POA: Diagnosis not present
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
## 2021-12-30 NOTE — Patient Instructions (Signed)
Cody Conway , Thank you for taking time to come for your Medicare Wellness Visit. I appreciate your ongoing commitment to your health goals. Please review the following plan we discussed and let me know if I can assist you in the future.   Screening recommendations/referrals: Colonoscopy: done 03/17/21 repeat every 3 years  Recommended yearly ophthalmology/optometry visit for glaucoma screening and checkup Recommended yearly dental visit for hygiene and checkup  Vaccinations: Influenza vaccine: declined  Pneumococcal vaccine: declined  Tdap vaccine: done 12/09/15 repeat every 10 years  Shingles vaccine: Shingrix discussed. Please contact your pharmacy for coverage information.    Covid-19: declined and discussed   Advanced directives: Advance directive discussed with you today. Even though you declined this today please call our office should you change your mind and we can give you the proper paperwork for you to fill out.  Conditions/risks identified: none at this time   Next appointment: Follow up in one year for your annual wellness visit   Preventive Care 40-64 Years, Male Preventive care refers to lifestyle choices and visits with your health care provider that can promote health and wellness. What does preventive care include? A yearly physical exam. This is also called an annual well check. Dental exams once or twice a year. Routine eye exams. Ask your health care provider how often you should have your eyes checked. Personal lifestyle choices, including: Daily care of your teeth and gums. Regular physical activity. Eating a healthy diet. Avoiding tobacco and drug use. Limiting alcohol use. Practicing safe sex. Taking low-dose aspirin every day starting at age 70. What happens during an annual well check? The services and screenings done by your health care provider during your annual well check will depend on your age, overall health, lifestyle risk factors, and family  history of disease. Counseling  Your health care provider may ask you questions about your: Alcohol use. Tobacco use. Drug use. Emotional well-being. Home and relationship well-being. Sexual activity. Eating habits. Work and work Statistician. Screening  You may have the following tests or measurements: Height, weight, and BMI. Blood pressure. Lipid and cholesterol levels. These may be checked every 5 years, or more frequently if you are over 87 years old. Skin check. Lung cancer screening. You may have this screening every year starting at age 25 if you have a 30-pack-year history of smoking and currently smoke or have quit within the past 15 years. Fecal occult blood test (FOBT) of the stool. You may have this test every year starting at age 43. Flexible sigmoidoscopy or colonoscopy. You may have a sigmoidoscopy every 5 years or a colonoscopy every 10 years starting at age 75. Prostate cancer screening. Recommendations will vary depending on your family history and other risks. Hepatitis C blood test. Hepatitis B blood test. Sexually transmitted disease (STD) testing. Diabetes screening. This is done by checking your blood sugar (glucose) after you have not eaten for a while (fasting). You may have this done every 1-3 years. Discuss your test results, treatment options, and if necessary, the need for more tests with your health care provider. Vaccines  Your health care provider may recommend certain vaccines, such as: Influenza vaccine. This is recommended every year. Tetanus, diphtheria, and acellular pertussis (Tdap, Td) vaccine. You may need a Td booster every 10 years. Zoster vaccine. You may need this after age 26. Pneumococcal 13-valent conjugate (PCV13) vaccine. You may need this if you have certain conditions and have not been vaccinated. Pneumococcal polysaccharide (PPSV23) vaccine. You may need  one or two doses if you smoke cigarettes or if you have certain  conditions. Talk to your health care provider about which screenings and vaccines you need and how often you need them. This information is not intended to replace advice given to you by your health care provider. Make sure you discuss any questions you have with your health care provider. Document Released: 06/06/2015 Document Revised: 01/28/2016 Document Reviewed: 03/11/2015 Elsevier Interactive Patient Education  2017 [place] Prevention in the Home Falls can cause injuries. They can happen to people of all ages. There are many things you can do to make your home safe and to help prevent falls. What can I do on the outside of my home? Regularly fix the edges of walkways and driveways and fix any cracks. Remove anything that might make you trip as you walk through a door, such as a raised step or threshold. Trim any bushes or trees on the path to your home. Use bright outdoor lighting. Clear any walking paths of anything that might make someone trip, such as rocks or tools. Regularly check to see if handrails are loose or broken. Make sure that both sides of any steps have handrails. Any raised decks and porches should have guardrails on the edges. Have any leaves, snow, or ice cleared regularly. Use sand or salt on walking paths during winter. Clean up any spills in your garage right away. This includes oil or grease spills. What can I do in the bathroom? Use night lights. Install grab bars by the toilet and in the tub and shower. Do not use towel bars as grab bars. Use non-skid mats or decals in the tub or shower. If you need to sit down in the shower, use a plastic, non-slip stool. Keep the floor dry. Clean up any water that spills on the floor as soon as it happens. Remove soap buildup in the tub or shower regularly. Attach bath mats securely with double-sided non-slip rug tape. Do not have throw rugs and other things on the floor that can make you trip. What can I do in  the bedroom? Use night lights. Make sure that you have a light by your bed that is easy to reach. Do not use any sheets or blankets that are too big for your bed. They should not hang down onto the floor. Have a firm chair that has side arms. You can use this for support while you get dressed. Do not have throw rugs and other things on the floor that can make you trip. What can I do in the kitchen? Clean up any spills right away. Avoid walking on wet floors. Keep items that you use a lot in easy-to-reach places. If you need to reach something above you, use a strong step stool that has a grab bar. Keep electrical cords out of the way. Do not use floor polish or wax that makes floors slippery. If you must use wax, use non-skid floor wax. Do not have throw rugs and other things on the floor that can make you trip. What can I do with my stairs? Do not leave any items on the stairs. Make sure that there are handrails on both sides of the stairs and use them. Fix handrails that are broken or loose. Make sure that handrails are as long as the stairways. Check any carpeting to make sure that it is firmly attached to the stairs. Fix any carpet that is loose or worn. Avoid having throw  rugs at the top or bottom of the stairs. If you do have throw rugs, attach them to the floor with carpet tape. Make sure that you have a light switch at the top of the stairs and the bottom of the stairs. If you do not have them, ask someone to add them for you. What else can I do to help prevent falls? Wear shoes that: Do not have high heels. Have rubber bottoms. Are comfortable and fit you well. Are closed at the toe. Do not wear sandals. If you use a stepladder: Make sure that it is fully opened. Do not climb a closed stepladder. Make sure that both sides of the stepladder are locked into place. Ask someone to hold it for you, if possible. Clearly mark and make sure that you can see: Any grab bars or  handrails. First and last steps. Where the edge of each step is. Use tools that help you move around (mobility aids) if they are needed. These include: Canes. Walkers. Scooters. Crutches. Turn on the lights when you go into a dark area. Replace any light bulbs as soon as they burn out. Set up your furniture so you have a clear path. Avoid moving your furniture around. If any of your floors are uneven, fix them. If there are any pets around you, be aware of where they are. Review your medicines with your doctor. Some medicines can make you feel dizzy. This can increase your chance of falling. Ask your doctor what other things that you can do to help prevent falls. This information is not intended to replace advice given to you by your health care provider. Make sure you discuss any questions you have with your health care provider. Document Released: 03/06/2009 Document Revised: 10/16/2015 Document Reviewed: 06/14/2014 Elsevier Interactive Patient Education  2017 [employer].

## 2022-01-14 ENCOUNTER — Telehealth: Payer: Self-pay | Admitting: Family Medicine

## 2022-01-14 MED ORDER — AMITRIPTYLINE HCL 50 MG PO TABS: 50.0000 mg | ORAL_TABLET | Freq: Every day | ORAL | 1 refills | Status: DC

## 2022-01-14 NOTE — Telephone Encounter (Signed)
Prescription sent. As long as he is doing fine then I would like him to follow-up in 4 months.

## 2022-01-14 NOTE — Telephone Encounter (Signed)
Patient advised refill sent. Follow up appointment scheduled.

## 2022-01-14 NOTE — Telephone Encounter (Signed)
Patient's last visit 10/30/21, last refill for amitriptyline 10/30/21 (30,1). Advised to follow up in 4 weeks.  Please review and advise. No medication pending

## 2022-01-14 NOTE — Telephone Encounter (Signed)
Pt called and needs a refill for Amitriptyine. He called the pharmacy and they said they were not going to refill the medication.

## 2022-03-10 ENCOUNTER — Ambulatory Visit (INDEPENDENT_AMBULATORY_CARE_PROVIDER_SITE_OTHER): Payer: Medicare Other | Admitting: Family Medicine

## 2022-03-10 ENCOUNTER — Encounter: Payer: Self-pay | Admitting: Family Medicine

## 2022-03-10 VITALS — BP 103/67 | HR 82 | Temp 97.6°F | Ht 67.0 in | Wt 179.6 lb

## 2022-03-10 DIAGNOSIS — R1013 Epigastric pain: Secondary | ICD-10-CM

## 2022-03-10 DIAGNOSIS — R1115 Cyclical vomiting syndrome unrelated to migraine: Secondary | ICD-10-CM | POA: Diagnosis not present

## 2022-03-10 DIAGNOSIS — R140 Abdominal distension (gaseous): Secondary | ICD-10-CM

## 2022-03-10 DIAGNOSIS — R5382 Chronic fatigue, unspecified: Secondary | ICD-10-CM | POA: Diagnosis not present

## 2022-03-10 MED ORDER — PANTOPRAZOLE SODIUM 40 MG PO TBEC: 40.0000 mg | DELAYED_RELEASE_TABLET | Freq: Every day | ORAL | 1 refills | Status: DC
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
## 2022-03-10 NOTE — Patient Instructions (Signed)
Take over the counter generic pepcid '20mg'$  twice a day for 10 days.  Also start taking the pantoprazole '40mg'$  once a day now.

## 2022-03-11 ENCOUNTER — Other Ambulatory Visit (INDEPENDENT_AMBULATORY_CARE_PROVIDER_SITE_OTHER): Payer: Medicare Other

## 2022-03-11 ENCOUNTER — Ambulatory Visit (HOSPITAL_BASED_OUTPATIENT_CLINIC_OR_DEPARTMENT_OTHER)
Admission: RE | Admit: 2022-03-11 | Discharge: 2022-03-11 | Disposition: A | Discharge status: Home / Self Care | Payer: Medicare Other | Source: Ambulatory Visit | Attending: Family Medicine | Admitting: Family Medicine

## 2022-03-11 ENCOUNTER — Other Ambulatory Visit: Payer: Medicare Other

## 2022-03-11 DIAGNOSIS — R1013 Epigastric pain: Secondary | ICD-10-CM | POA: Insufficient documentation

## 2022-03-11 DIAGNOSIS — R5382 Chronic fatigue, unspecified: Secondary | ICD-10-CM | POA: Diagnosis not present

## 2022-03-11 LAB — LIPASE: Lipase: 13 U/L (ref 11.0–59.0)

## 2022-03-11 LAB — CBC WITH DIFFERENTIAL/PLATELET
Basophils Absolute: 0 10*3/uL (ref 0.0–0.1)
Basophils Relative: 0.5 % (ref 0.0–3.0)
Eosinophils Absolute: 0.2 10*3/uL (ref 0.0–0.7)
Eosinophils Relative: 2.1 % (ref 0.0–5.0)
HCT: 44.9 % (ref 39.0–52.0)
Hemoglobin: 15 g/dL (ref 13.0–17.0)
Lymphocytes Relative: 32.7 % (ref 12.0–46.0)
Lymphs Abs: 2.5 10*3/uL (ref 0.7–4.0)
MCHC: 33.4 g/dL (ref 30.0–36.0)
MCV: 96.6 fl (ref 78.0–100.0)
Monocytes Absolute: 0.6 10*3/uL (ref 0.1–1.0)
Monocytes Relative: 7.5 % (ref 3.0–12.0)
Neutro Abs: 4.3 10*3/uL (ref 1.4–7.7)
Neutrophils Relative %: 57.2 % (ref 43.0–77.0)
Platelets: 287 10*3/uL (ref 150.0–400.0)
RBC: 4.65 Mil/uL (ref 4.22–5.81)
RDW: 13.1 % (ref 11.5–15.5)
WBC: 7.5 10*3/uL (ref 4.0–10.5)

## 2022-03-12 ENCOUNTER — Other Ambulatory Visit: Payer: Self-pay | Admitting: Family Medicine

## 2022-03-12 ENCOUNTER — Ambulatory Visit (HOSPITAL_BASED_OUTPATIENT_CLINIC_OR_DEPARTMENT_OTHER)
Admission: RE | Admit: 2022-03-12 | Discharge: 2022-03-12 | Disposition: A | Discharge status: Home / Self Care | Payer: Medicare Other | Source: Ambulatory Visit | Attending: Family Medicine | Admitting: Family Medicine

## 2022-03-12 DIAGNOSIS — R1013 Epigastric pain: Secondary | ICD-10-CM | POA: Diagnosis not present

## 2022-03-12 LAB — IRON,TIBC AND FERRITIN PANEL
%SAT: 48 % (calc) (ref 20–48)
Ferritin: 51 ng/mL (ref 38–380)
Iron: 147 ug/dL (ref 50–180)
TIBC: 305 mcg/dL (calc) (ref 250–425)

## 2022-03-24 ENCOUNTER — Encounter: Payer: Self-pay | Admitting: Family Medicine

## 2022-03-24 ENCOUNTER — Ambulatory Visit (INDEPENDENT_AMBULATORY_CARE_PROVIDER_SITE_OTHER): Payer: Medicare Other | Admitting: Family Medicine

## 2022-03-24 VITALS — BP 116/76 | HR 76 | Temp 98.1°F | Resp 12 | Ht 67.0 in | Wt 178.6 lb

## 2022-03-24 DIAGNOSIS — T50905A Adverse effect of unspecified drugs, medicaments and biological substances, initial encounter: Secondary | ICD-10-CM | POA: Diagnosis not present

## 2022-03-24 DIAGNOSIS — K2900 Acute gastritis without bleeding: Secondary | ICD-10-CM

## 2022-03-24 MED ORDER — FAMOTIDINE 40 MG PO TABS: ORAL_TABLET | ORAL | 6 refills | Status: DC
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
## 2022-04-07 ENCOUNTER — Encounter: Payer: Self-pay | Admitting: Family Medicine

## 2022-04-07 ENCOUNTER — Ambulatory Visit: Payer: Medicare Other | Admitting: Family Medicine
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
## 2022-04-08 ENCOUNTER — Ambulatory Visit (INDEPENDENT_AMBULATORY_CARE_PROVIDER_SITE_OTHER): Payer: Medicare Other | Admitting: Family Medicine

## 2022-04-08 ENCOUNTER — Encounter: Payer: Self-pay | Admitting: Family Medicine

## 2022-04-08 VITALS — BP 110/76 | HR 87 | Temp 98.1°F | Ht 67.0 in | Wt 182.8 lb

## 2022-04-08 DIAGNOSIS — G4719 Other hypersomnia: Secondary | ICD-10-CM

## 2022-04-08 DIAGNOSIS — G4733 Obstructive sleep apnea (adult) (pediatric): Secondary | ICD-10-CM

## 2022-04-08 DIAGNOSIS — R5382 Chronic fatigue, unspecified: Secondary | ICD-10-CM | POA: Diagnosis not present

## 2022-04-08 DIAGNOSIS — T887XXA Unspecified adverse effect of drug or medicament, initial encounter: Secondary | ICD-10-CM

## 2022-04-08 DIAGNOSIS — K2950 Unspecified chronic gastritis without bleeding: Secondary | ICD-10-CM | POA: Diagnosis not present

## 2022-04-08 DIAGNOSIS — R1115 Cyclical vomiting syndrome unrelated to migraine: Secondary | ICD-10-CM

## 2022-04-08 LAB — VITAMIN B12: Vitamin B-12: 254 pg/mL (ref 211–911)

## 2022-04-08 LAB — TSH: TSH: 1.42 u[IU]/mL (ref 0.35–5.50)
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
## 2022-04-09 ENCOUNTER — Telehealth: Payer: Self-pay

## 2022-04-09 NOTE — Telephone Encounter (Signed)
-----   Message from Tammi Sou, MD sent at 04/09/2022  8:06 AM EST ----- Thyroid normal. Vitamin B12 borderline low. I do not think this is the explanation for his fatigue but it would be good for him to go on over-the-counter vitamin B12 tab.  Get sublingual 1000 mcg tabs and take 1 daily.

## 2022-04-09 NOTE — Telephone Encounter (Signed)
Pt advised of results. He was not able to write down information for b12 tab. Advised him to call back and someone would inform him.

## 2022-04-21 ENCOUNTER — Institutional Professional Consult (permissible substitution): Payer: Medicare Other | Admitting: Primary Care

## 2022-05-06 ENCOUNTER — Encounter: Payer: Self-pay | Admitting: Family Medicine

## 2022-05-06 ENCOUNTER — Ambulatory Visit (INDEPENDENT_AMBULATORY_CARE_PROVIDER_SITE_OTHER): Payer: Medicare Other | Admitting: Family Medicine

## 2022-05-06 VITALS — BP 128/77 | HR 84 | Temp 97.8°F | Ht 67.0 in | Wt 182.4 lb

## 2022-05-06 DIAGNOSIS — T50905D Adverse effect of unspecified drugs, medicaments and biological substances, subsequent encounter: Secondary | ICD-10-CM

## 2022-05-06 DIAGNOSIS — R5383 Other fatigue: Secondary | ICD-10-CM

## 2022-05-06 DIAGNOSIS — G4719 Other hypersomnia: Secondary | ICD-10-CM | POA: Diagnosis not present
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
## 2022-05-07 ENCOUNTER — Institutional Professional Consult (permissible substitution): Payer: Medicare Other | Admitting: Primary Care

## 2022-05-18 ENCOUNTER — Ambulatory Visit (INDEPENDENT_AMBULATORY_CARE_PROVIDER_SITE_OTHER): Payer: Medicare Other | Admitting: Family Medicine

## 2022-05-18 ENCOUNTER — Encounter: Payer: Self-pay | Admitting: Family Medicine

## 2022-05-18 ENCOUNTER — Telehealth: Payer: Self-pay

## 2022-05-18 VITALS — BP 113/71 | HR 83 | Temp 97.8°F | Ht 67.0 in | Wt 180.0 lb

## 2022-05-18 DIAGNOSIS — R52 Pain, unspecified: Secondary | ICD-10-CM | POA: Diagnosis not present

## 2022-05-18 DIAGNOSIS — R5382 Chronic fatigue, unspecified: Secondary | ICD-10-CM | POA: Diagnosis not present

## 2022-05-18 DIAGNOSIS — R1115 Cyclical vomiting syndrome unrelated to migraine: Secondary | ICD-10-CM

## 2022-05-18 DIAGNOSIS — R5081 Fever presenting with conditions classified elsewhere: Secondary | ICD-10-CM | POA: Diagnosis not present

## 2022-05-18 LAB — COMPREHENSIVE METABOLIC PANEL
ALT: 13 U/L (ref 0–53)
AST: 17 U/L (ref 0–37)
Albumin: 4.2 g/dL (ref 3.5–5.2)
Alkaline Phosphatase: 82 U/L (ref 39–117)
BUN: 10 mg/dL (ref 6–23)
CO2: 29 mEq/L (ref 19–32)
Calcium: 8.9 mg/dL (ref 8.4–10.5)
Chloride: 104 mEq/L (ref 96–112)
Creatinine, Ser: 0.97 mg/dL (ref 0.40–1.50)
GFR: 89.02 mL/min (ref >60.00)
Glucose, Bld: 78 mg/dL (ref 70–99)
Potassium: 4.2 mEq/L (ref 3.5–5.1)
Sodium: 141 mEq/L (ref 135–145)
Total Bilirubin: 0.3 mg/dL (ref 0.2–1.2)
Total Protein: 6.8 g/dL (ref 6.0–8.3)

## 2022-05-18 LAB — CBC WITH DIFFERENTIAL/PLATELET
Basophils Absolute: 0 10*3/uL (ref 0.0–0.1)
Basophils Relative: 0.7 % (ref 0.0–3.0)
Eosinophils Absolute: 0.1 10*3/uL (ref 0.0–0.7)
Eosinophils Relative: 1.1 % (ref 0.0–5.0)
HCT: 43.8 % (ref 39.0–52.0)
Hemoglobin: 15.1 g/dL (ref 13.0–17.0)
Lymphocytes Relative: 28.2 % (ref 12.0–46.0)
Lymphs Abs: 1.5 10*3/uL (ref 0.7–4.0)
MCHC: 34.5 g/dL (ref 30.0–36.0)
MCV: 95.7 fl (ref 78.0–100.0)
Monocytes Absolute: 0.8 10*3/uL (ref 0.1–1.0)
Monocytes Relative: 15 % — ABNORMAL HIGH (ref 3.0–12.0)
Neutro Abs: 2.9 10*3/uL (ref 1.4–7.7)
Neutrophils Relative %: 55 % (ref 43.0–77.0)
Platelets: 264 10*3/uL (ref 150.0–400.0)
RBC: 4.58 Mil/uL (ref 4.22–5.81)
RDW: 13.4 % (ref 11.5–15.5)
WBC: 5.3 10*3/uL (ref 4.0–10.5)

## 2022-05-18 LAB — C-REACTIVE PROTEIN: CRP: 2.3 mg/dL (ref 0.5–20.0)

## 2022-05-18 LAB — SEDIMENTATION RATE: Sed Rate: 20 mm/hr (ref 0–20)

## 2022-05-18 MED ORDER — CROMOLYN SODIUM 5.2 MG/ACT NA AERS: 1.0000 | INHALATION_SPRAY | Freq: Four times a day (QID) | NASAL | 3 refills | Status: DC

## 2022-05-18 MED ORDER — AZELASTINE-FLUTICASONE 137-50 MCG/ACT NA SUSP: 1.0000 | Freq: Two times a day (BID) | NASAL | 6 refills | Status: DC

## 2022-05-18 NOTE — Telephone Encounter (Signed)
oK, new rx sent

## 2022-05-18 NOTE — Telephone Encounter (Signed)
Pt advised of new rx.

## 2022-05-18 NOTE — Telephone Encounter (Signed)
Please Advise if alternative or over the counter he can use.

## 2022-05-18 NOTE — Telephone Encounter (Signed)
Patient was seen today by Dr. Anitra Lauth. Patient states his insurance will not pay for prescription nose spray. Patient has medicaid.  [place]  Please call patient at [phone]
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
## 2022-05-19 LAB — LYME DISEASE SEROLOGY W/REFLEX: Lyme Total Antibody EIA: NEGATIVE

## 2022-05-27 ENCOUNTER — Institutional Professional Consult (permissible substitution): Payer: Medicare Other | Admitting: Primary Care

## 2022-06-09 ENCOUNTER — Telehealth: Payer: Self-pay | Admitting: Family Medicine

## 2022-06-09 NOTE — Telephone Encounter (Signed)
Patient refill request.  Out of refills.  Patient has been seen recently.  [place]  sildenafil (REVATIO) 20 MG tablet

## 2022-06-10 ENCOUNTER — Ambulatory Visit: Payer: Medicare Other | Admitting: Family Medicine

## 2022-06-10 MED ORDER — SILDENAFIL CITRATE 20 MG PO TABS: ORAL_TABLET | ORAL | 6 refills | Status: DC
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
## 2022-06-10 NOTE — Telephone Encounter (Signed)
Pt advised refill sent. °

## 2022-06-10 NOTE — Addendum Note (Signed)
Addended by: Deveron Furlong D on: 06/10/2022 08:17 AM   Modules accepted: Orders

## 2022-06-14 ENCOUNTER — Ambulatory Visit: Payer: Medicare Other | Admitting: Family Medicine
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
## 2022-06-16 ENCOUNTER — Ambulatory Visit: Payer: Medicare Other | Admitting: Family Medicine

## 2022-06-19 ENCOUNTER — Other Ambulatory Visit: Payer: Self-pay | Admitting: Family Medicine

## 2022-06-23 ENCOUNTER — Ambulatory Visit (INDEPENDENT_AMBULATORY_CARE_PROVIDER_SITE_OTHER): Payer: Medicaid Other | Admitting: Family Medicine

## 2022-06-23 DIAGNOSIS — R1115 Cyclical vomiting syndrome unrelated to migraine: Secondary | ICD-10-CM
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
## 2022-06-24 ENCOUNTER — Encounter: Payer: Self-pay | Admitting: Family Medicine

## 2022-07-05 ENCOUNTER — Ambulatory Visit: Payer: 59 | Admitting: Family Medicine

## 2022-07-08 ENCOUNTER — Ambulatory Visit (INDEPENDENT_AMBULATORY_CARE_PROVIDER_SITE_OTHER): Payer: 59 | Admitting: Family Medicine

## 2022-07-08 ENCOUNTER — Encounter: Payer: Self-pay | Admitting: Family Medicine

## 2022-07-08 ENCOUNTER — Telehealth: Payer: Self-pay

## 2022-07-08 VITALS — BP 118/76 | HR 78 | Temp 97.6°F | Ht 67.0 in | Wt 186.2 lb

## 2022-07-08 DIAGNOSIS — R140 Abdominal distension (gaseous): Secondary | ICD-10-CM

## 2022-07-08 DIAGNOSIS — R1115 Cyclical vomiting syndrome unrelated to migraine: Secondary | ICD-10-CM

## 2022-07-08 NOTE — Telephone Encounter (Signed)
Good afternoon Sherri,  Dr.McGowen placed an order for NM Gastric Emptying. The order is not stat, original location is [facility]. We would like to have patient scheduled with imaging location that can complete this the soonest. Thanks for your help!
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
## 2022-07-09 NOTE — Telephone Encounter (Signed)
The soonest without it being marked urgent is 3/11.24 If that date works than cool. IF not he would need to update to STAT.  The location also needs to changed NM are not done at Any of the Med centers [facility] or [facility] in [place]. I will hold off on calling pt with appt until I hear back from you on Monday.

## 2022-07-10 ENCOUNTER — Other Ambulatory Visit: Payer: Self-pay | Admitting: Family Medicine

## 2022-07-12 NOTE — Telephone Encounter (Signed)
See message below °

## 2022-07-12 NOTE — Telephone Encounter (Signed)
I will keep 03/11 appt. Patient has been contacted  and is aware of appt date and time

## 2022-07-12 NOTE — Telephone Encounter (Signed)
Not stat. [place] pls thx

## 2022-07-12 NOTE — Telephone Encounter (Signed)
Please review and advise.

## 2022-08-02 ENCOUNTER — Encounter (HOSPITAL_COMMUNITY): Payer: 59

## 2022-09-02 ENCOUNTER — Ambulatory Visit: Admission: EM | Admit: 2022-09-02 | Discharge: 2022-09-02 | Discharge status: Against Medical Advice | Payer: 59

## 2022-09-02 DIAGNOSIS — Z888 Allergy status to other drugs, medicaments and biological substances status: Secondary | ICD-10-CM | POA: Diagnosis not present

## 2022-09-02 DIAGNOSIS — S0990XA Unspecified injury of head, initial encounter: Secondary | ICD-10-CM | POA: Diagnosis not present

## 2022-09-02 DIAGNOSIS — Z882 Allergy status to sulfonamides status: Secondary | ICD-10-CM | POA: Diagnosis not present

## 2022-09-02 DIAGNOSIS — Z79899 Other long term (current) drug therapy: Secondary | ICD-10-CM | POA: Diagnosis not present

## 2022-09-02 DIAGNOSIS — Z880 Allergy status to penicillin: Secondary | ICD-10-CM | POA: Diagnosis not present

## 2022-09-02 DIAGNOSIS — S060XAA Concussion with loss of consciousness status unknown, initial encounter: Secondary | ICD-10-CM | POA: Diagnosis not present

## 2022-09-02 DIAGNOSIS — W06XXXA Fall from bed, initial encounter: Secondary | ICD-10-CM | POA: Diagnosis not present

## 2022-09-02 DIAGNOSIS — F17210 Nicotine dependence, cigarettes, uncomplicated: Secondary | ICD-10-CM | POA: Diagnosis not present

## 2022-09-06 ENCOUNTER — Telehealth: Payer: Self-pay

## 2022-09-06 NOTE — Telephone Encounter (Signed)
Error

## 2022-09-08 ENCOUNTER — Encounter: Payer: Self-pay | Admitting: Family Medicine

## 2022-09-08 ENCOUNTER — Ambulatory Visit (INDEPENDENT_AMBULATORY_CARE_PROVIDER_SITE_OTHER): Payer: 59 | Admitting: Family Medicine

## 2022-09-08 VITALS — BP 116/75 | HR 83 | Temp 98.1°F | Ht 67.0 in | Wt 186.2 lb

## 2022-09-08 DIAGNOSIS — S060X9D Concussion with loss of consciousness of unspecified duration, subsequent encounter: Secondary | ICD-10-CM

## 2022-09-08 DIAGNOSIS — T50905D Adverse effect of unspecified drugs, medicaments and biological substances, subsequent encounter: Secondary | ICD-10-CM

## 2022-09-08 DIAGNOSIS — R058 Other specified cough: Secondary | ICD-10-CM

## 2022-09-08 DIAGNOSIS — R1115 Cyclical vomiting syndrome unrelated to migraine: Secondary | ICD-10-CM | POA: Diagnosis not present

## 2022-09-08 DIAGNOSIS — M25521 Pain in right elbow: Secondary | ICD-10-CM

## 2022-09-08 MED ORDER — AMITRIPTYLINE HCL 10 MG PO TABS
ORAL_TABLET | ORAL | 1 refills | Status: DC
Start: 2022-09-08 — End: 2022-10-13
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
## 2022-09-22 ENCOUNTER — Ambulatory Visit (INDEPENDENT_AMBULATORY_CARE_PROVIDER_SITE_OTHER): Payer: 59 | Admitting: Family Medicine

## 2022-09-22 ENCOUNTER — Encounter: Payer: Self-pay | Admitting: Family Medicine

## 2022-09-22 VITALS — BP 112/68 | HR 81 | Temp 98.0°F | Ht 67.0 in | Wt 183.8 lb

## 2022-09-22 DIAGNOSIS — L989 Disorder of the skin and subcutaneous tissue, unspecified: Secondary | ICD-10-CM | POA: Diagnosis not present

## 2022-09-22 DIAGNOSIS — G4719 Other hypersomnia: Secondary | ICD-10-CM

## 2022-09-22 DIAGNOSIS — M5137 Other intervertebral disc degeneration, lumbosacral region: Secondary | ICD-10-CM | POA: Diagnosis not present

## 2022-09-22 DIAGNOSIS — M545 Low back pain, unspecified: Secondary | ICD-10-CM | POA: Diagnosis not present

## 2022-09-22 DIAGNOSIS — G8929 Other chronic pain: Secondary | ICD-10-CM | POA: Diagnosis not present

## 2022-09-22 DIAGNOSIS — M533 Sacrococcygeal disorders, not elsewhere classified: Secondary | ICD-10-CM

## 2022-09-22 DIAGNOSIS — T50905D Adverse effect of unspecified drugs, medicaments and biological substances, subsequent encounter: Secondary | ICD-10-CM

## 2022-09-22 MED ORDER — LIDOCAINE 4 % EX PTCH: 3.0000 | MEDICATED_PATCH | CUTANEOUS | 1 refills | Status: DC

## 2022-09-22 MED ORDER — PREDNISONE 10 MG PO TABS: ORAL_TABLET | ORAL | 0 refills | Status: DC
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
## 2022-10-08 ENCOUNTER — Telehealth: Payer: Self-pay

## 2022-10-08 NOTE — Telephone Encounter (Signed)
Pt is scheduled for 10/27/22 and states he is getting married tomorrow and does not want to feel bad

## 2022-10-08 NOTE — Telephone Encounter (Signed)
Patient requesting another refill on prednisone. Last fill date 09/22/22.  Patient states he is still not feeling well, and prednisone helped him.  Please call patient [phone]  [facility] - [address]  predniSONE (DELTASONE) 10 MG tablet

## 2022-10-10 NOTE — Telephone Encounter (Signed)
Can he be more specific about what he means by feeling bad? What specific symptoms?

## 2022-10-11 NOTE — Telephone Encounter (Signed)
Pt states more pain in elbows and stomach. [place] the beach. He will call back if medication still needed.

## 2022-10-13 ENCOUNTER — Other Ambulatory Visit: Payer: Self-pay | Admitting: Family Medicine

## 2022-10-13 DIAGNOSIS — R1115 Cyclical vomiting syndrome unrelated to migraine: Secondary | ICD-10-CM | POA: Diagnosis not present

## 2022-10-13 NOTE — Telephone Encounter (Signed)
Pt states he went to UC today because he was dehydrated and had to be given fluids. Pt states he has been going thru this for about 2 years and would like to know what he needs to do as far as his medication (amitriptyline (ELAVIL) 10 MG tablet) is concerned because he does not want to be on a medication that ise causing him to be dehydrated.  Please review and advise

## 2022-10-21 ENCOUNTER — Other Ambulatory Visit: Payer: Self-pay

## 2022-10-21 ENCOUNTER — Other Ambulatory Visit: Payer: Self-pay | Admitting: Family Medicine

## 2022-10-21 NOTE — Telephone Encounter (Signed)
RF request for famotidine LOV: 09/22/22 Next ov: 10/27/22 Last written: 10/21/22 (180,1)   Refill sent, duplicate request

## 2022-10-27 ENCOUNTER — Encounter: Payer: Self-pay | Admitting: Family Medicine

## 2022-10-27 ENCOUNTER — Ambulatory Visit (INDEPENDENT_AMBULATORY_CARE_PROVIDER_SITE_OTHER): Payer: 59 | Admitting: Family Medicine

## 2022-10-27 VITALS — Ht 67.0 in | Wt 177.6 lb

## 2022-10-27 DIAGNOSIS — R1115 Cyclical vomiting syndrome unrelated to migraine: Secondary | ICD-10-CM | POA: Diagnosis not present

## 2022-10-27 MED ORDER — ONDANSETRON 8 MG PO TBDP: 8.0000 mg | ORAL_TABLET | Freq: Three times a day (TID) | ORAL | 3 refills | Status: DC | PRN

## 2022-10-27 NOTE — Patient Instructions (Signed)
[facility]  Address: [address] [place], [place] [address]  Phone: [phone]
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
## 2022-11-09 ENCOUNTER — Other Ambulatory Visit: Payer: Self-pay

## 2022-11-09 ENCOUNTER — Telehealth: Payer: Self-pay

## 2022-11-09 NOTE — Telephone Encounter (Signed)
Pt called and stated he was supposed to have a prescription for Amitrptyline 25Mg . He uses the [facility] in [place].

## 2022-11-10 MED ORDER — AMITRIPTYLINE HCL 25 MG PO TABS
25.0000 mg | ORAL_TABLET | Freq: Every day | ORAL | 1 refills | Status: DC
Start: 2022-11-10 — End: 2023-05-04

## 2022-11-10 NOTE — Telephone Encounter (Signed)
Pt advised medication sent

## 2022-11-10 NOTE — Addendum Note (Signed)
Addended by: Jeoffrey Massed on: 11/10/2022 08:06 AM   Modules accepted: Orders

## 2022-11-10 NOTE — Telephone Encounter (Signed)
OK amitriptyline rx sent

## 2022-11-22 ENCOUNTER — Other Ambulatory Visit: Payer: Self-pay | Admitting: Family Medicine

## 2023-01-12 ENCOUNTER — Ambulatory Visit (INDEPENDENT_AMBULATORY_CARE_PROVIDER_SITE_OTHER): Payer: 59

## 2023-01-12 VITALS — Wt 177.0 lb

## 2023-01-12 DIAGNOSIS — Z0000 Encounter for general adult medical examination without abnormal findings: Secondary | ICD-10-CM
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
## 2023-01-12 NOTE — Patient Instructions (Signed)
Mr. Shangraw , Thank you for taking time to come for your Medicare Wellness Visit. I appreciate your ongoing commitment to your health goals. Please review the following plan we discussed and let me know if I can assist you in the future.   Referrals/Orders/Follow-Ups/Clinician Recommendations: completely quit smoking   This is a list of the screening recommended for you and due dates:  Health Maintenance  Topic Date Due   Screening for Lung Cancer  Never done   Zoster (Shingles) Vaccine (1 of 2) Never done   Flu Shot  12/23/2022   Medicare Annual Wellness Visit  01/12/2024   Colon Cancer Screening  03/17/2024   DTaP/Tdap/Td vaccine (3 - Td or Tdap) 12/08/2025   Hepatitis C Screening  Completed   HIV Screening  Completed   HPV Vaccine  Aged Out   COVID-19 Vaccine  Discontinued    Advanced directives: (Declined) Advance directive discussed with you today. Even though you declined this today, please call our office should you change your mind, and we can give you the proper paperwork for you to fill out.  Next Medicare Annual Wellness Visit scheduled for next year: Yes

## 2023-02-04 ENCOUNTER — Ambulatory Visit (INDEPENDENT_AMBULATORY_CARE_PROVIDER_SITE_OTHER): Payer: 59 | Admitting: Family Medicine

## 2023-02-04 ENCOUNTER — Telehealth: Payer: Self-pay | Admitting: Family Medicine

## 2023-02-04 ENCOUNTER — Encounter: Payer: Self-pay | Admitting: Family Medicine

## 2023-02-04 VITALS — BP 129/82 | HR 83 | Temp 98.6°F | Ht 68.75 in | Wt 176.4 lb

## 2023-02-04 DIAGNOSIS — Z125 Encounter for screening for malignant neoplasm of prostate: Secondary | ICD-10-CM

## 2023-02-04 DIAGNOSIS — Z0000 Encounter for general adult medical examination without abnormal findings: Secondary | ICD-10-CM | POA: Diagnosis not present

## 2023-02-04 DIAGNOSIS — R1115 Cyclical vomiting syndrome unrelated to migraine: Secondary | ICD-10-CM | POA: Diagnosis not present

## 2023-02-04 DIAGNOSIS — Z79899 Other long term (current) drug therapy: Secondary | ICD-10-CM | POA: Diagnosis not present

## 2023-02-04 DIAGNOSIS — Z791 Long term (current) use of non-steroidal anti-inflammatories (NSAID): Secondary | ICD-10-CM | POA: Diagnosis not present

## 2023-02-04 DIAGNOSIS — N5201 Erectile dysfunction due to arterial insufficiency: Secondary | ICD-10-CM | POA: Diagnosis not present

## 2023-02-04 LAB — CBC WITH DIFFERENTIAL/PLATELET
Basophils Absolute: 0 10*3/uL (ref 0.0–0.1)
Basophils Relative: 0.5 % (ref 0.0–3.0)
Eosinophils Absolute: 0.1 10*3/uL (ref 0.0–0.7)
Eosinophils Relative: 1.5 % (ref 0.0–5.0)
HCT: 46.4 % (ref 39.0–52.0)
Hemoglobin: 15.6 g/dL (ref 13.0–17.0)
Lymphocytes Relative: 36 % (ref 12.0–46.0)
Lymphs Abs: 2.6 10*3/uL (ref 0.7–4.0)
MCHC: 33.6 g/dL (ref 30.0–36.0)
MCV: 95.4 fl (ref 78.0–100.0)
Monocytes Absolute: 0.6 10*3/uL (ref 0.1–1.0)
Monocytes Relative: 7.8 % (ref 3.0–12.0)
Neutro Abs: 4 10*3/uL (ref 1.4–7.7)
Neutrophils Relative %: 54.2 % (ref 43.0–77.0)
Platelets: 309 10*3/uL (ref 150.0–400.0)
RBC: 4.86 Mil/uL (ref 4.22–5.81)
RDW: 13.3 % (ref 11.5–15.5)
WBC: 7.3 10*3/uL (ref 4.0–10.5)

## 2023-02-04 LAB — PSA: PSA: 2.36 ng/mL (ref 0.10–4.00)

## 2023-02-04 LAB — COMPREHENSIVE METABOLIC PANEL
ALT: 18 U/L (ref 0–53)
AST: 21 U/L (ref 0–37)
Albumin: 4.3 g/dL (ref 3.5–5.2)
Alkaline Phosphatase: 82 U/L (ref 39–117)
BUN: 9 mg/dL (ref 6–23)
CO2: 31 meq/L (ref 19–32)
Calcium: 9.1 mg/dL (ref 8.4–10.5)
Chloride: 103 meq/L (ref 96–112)
Creatinine, Ser: 0.92 mg/dL (ref 0.40–1.50)
GFR: 94.38 mL/min (ref >60.00)
Glucose, Bld: 75 mg/dL (ref 70–99)
Potassium: 4.2 meq/L (ref 3.5–5.1)
Sodium: 139 meq/L (ref 135–145)
Total Bilirubin: 0.3 mg/dL (ref 0.2–1.2)
Total Protein: 6.9 g/dL (ref 6.0–8.3)

## 2023-02-04 LAB — LIPID PANEL
Cholesterol: 252 mg/dL — ABNORMAL HIGH (ref 0–200)
HDL: 48.8 mg/dL (ref >39.00)
LDL Cholesterol: 167 mg/dL — ABNORMAL HIGH (ref 0–99)
NonHDL: 203.1
Total CHOL/HDL Ratio: 5
Triglycerides: 179 mg/dL — ABNORMAL HIGH (ref 0.0–149.0)
VLDL: 35.8 mg/dL (ref 0.0–40.0)

## 2023-02-04 LAB — TSH: TSH: 1.51 u[IU]/mL (ref 0.35–5.50)

## 2023-02-04 MED ORDER — ONDANSETRON 8 MG PO TBDP: 8.0000 mg | ORAL_TABLET | Freq: Three times a day (TID) | ORAL | 3 refills | Status: AC | PRN

## 2023-02-04 MED ORDER — FAMOTIDINE 40 MG PO TABS: 40.0000 mg | ORAL_TABLET | Freq: Two times a day (BID) | ORAL | 1 refills | Status: DC

## 2023-02-04 MED ORDER — SILDENAFIL CITRATE 20 MG PO TABS: ORAL_TABLET | ORAL | 3 refills | Status: DC

## 2023-02-04 NOTE — Telephone Encounter (Signed)
Patien's medication sildenafil (REVATIO) 20 MG tablet  was sent to the wrong pharmacy. He would like it to be sent to [facility]  [facility] [address] [place], [place] [address]

## 2023-02-04 NOTE — Telephone Encounter (Signed)
Refill resent to [facility]. Patient advised of update
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
## 2023-03-04 DIAGNOSIS — Z79899 Other long term (current) drug therapy: Secondary | ICD-10-CM | POA: Diagnosis not present

## 2023-03-04 DIAGNOSIS — R1115 Cyclical vomiting syndrome unrelated to migraine: Secondary | ICD-10-CM | POA: Diagnosis not present

## 2023-03-09 DIAGNOSIS — Z79899 Other long term (current) drug therapy: Secondary | ICD-10-CM | POA: Diagnosis not present

## 2023-03-14 ENCOUNTER — Other Ambulatory Visit: Payer: Self-pay | Admitting: Family Medicine

## 2023-03-19 DIAGNOSIS — M7989 Other specified soft tissue disorders: Secondary | ICD-10-CM | POA: Diagnosis not present

## 2023-03-19 DIAGNOSIS — S61231A Puncture wound without foreign body of left index finger without damage to nail, initial encounter: Secondary | ICD-10-CM | POA: Diagnosis not present

## 2023-03-19 DIAGNOSIS — Z23 Encounter for immunization: Secondary | ICD-10-CM | POA: Diagnosis not present

## 2023-03-19 DIAGNOSIS — F17210 Nicotine dependence, cigarettes, uncomplicated: Secondary | ICD-10-CM | POA: Diagnosis not present

## 2023-04-01 DIAGNOSIS — Z79899 Other long term (current) drug therapy: Secondary | ICD-10-CM | POA: Diagnosis not present

## 2023-04-01 DIAGNOSIS — R1115 Cyclical vomiting syndrome unrelated to migraine: Secondary | ICD-10-CM | POA: Diagnosis not present

## 2023-04-11 DIAGNOSIS — Z79899 Other long term (current) drug therapy: Secondary | ICD-10-CM | POA: Diagnosis not present

## 2023-04-27 ENCOUNTER — Other Ambulatory Visit: Payer: Self-pay | Admitting: Family Medicine

## 2023-04-29 DIAGNOSIS — R1115 Cyclical vomiting syndrome unrelated to migraine: Secondary | ICD-10-CM | POA: Diagnosis not present

## 2023-04-29 DIAGNOSIS — R030 Elevated blood-pressure reading, without diagnosis of hypertension: Secondary | ICD-10-CM | POA: Diagnosis not present

## 2023-05-04 ENCOUNTER — Telehealth: Payer: Self-pay

## 2023-05-04 MED ORDER — AMITRIPTYLINE HCL 25 MG PO TABS: 25.0000 mg | ORAL_TABLET | Freq: Every day | ORAL | 1 refills | Status: DC

## 2023-05-04 NOTE — Telephone Encounter (Signed)
Patient out of meds. Patient has tried several time to reach pharmacy and no one is answering phone.   Prescription Request  05/04/2023  LOV: Visit date not found  What is the name of the medication or equipment?   amitriptyline (ELAVIL) 25 MG tablet    Have you contacted your pharmacy to request a refill? Yes   Which pharmacy would you like this sent to?  [id number] - [place], [place] - [address] [facility] [address] [place]  [address] Phone: [phone] Fax: [phone]  [facility] [place], [place] - [address] [place] [address] Phone: [phone] Fax: [phone]    Patient notified that their request is being sent to the clinical staff for review and that they should receive a response within 2 business days.   Please advise at Mobile [phone] (mobile)

## 2023-05-04 NOTE — Telephone Encounter (Signed)
Refill sent.

## 2023-05-05 ENCOUNTER — Other Ambulatory Visit: Payer: Self-pay | Admitting: Family Medicine

## 2023-05-10 ENCOUNTER — Ambulatory Visit: Payer: 59 | Admitting: Dermatology

## 2023-06-21 DIAGNOSIS — R1115 Cyclical vomiting syndrome unrelated to migraine: Secondary | ICD-10-CM | POA: Diagnosis not present

## 2023-07-22 DIAGNOSIS — R1115 Cyclical vomiting syndrome unrelated to migraine: Secondary | ICD-10-CM | POA: Diagnosis not present

## 2023-08-19 DIAGNOSIS — R030 Elevated blood-pressure reading, without diagnosis of hypertension: Secondary | ICD-10-CM | POA: Diagnosis not present

## 2023-08-19 DIAGNOSIS — R1115 Cyclical vomiting syndrome unrelated to migraine: Secondary | ICD-10-CM | POA: Diagnosis not present

## 2023-09-19 DIAGNOSIS — Z0000 Encounter for general adult medical examination without abnormal findings: Secondary | ICD-10-CM | POA: Diagnosis not present

## 2023-09-19 DIAGNOSIS — Z79899 Other long term (current) drug therapy: Secondary | ICD-10-CM | POA: Diagnosis not present

## 2023-09-19 DIAGNOSIS — F17210 Nicotine dependence, cigarettes, uncomplicated: Secondary | ICD-10-CM | POA: Diagnosis not present

## 2023-09-19 DIAGNOSIS — R1115 Cyclical vomiting syndrome unrelated to migraine: Secondary | ICD-10-CM | POA: Diagnosis not present

## 2023-09-19 DIAGNOSIS — R030 Elevated blood-pressure reading, without diagnosis of hypertension: Secondary | ICD-10-CM | POA: Diagnosis not present

## 2023-09-21 DIAGNOSIS — Z79899 Other long term (current) drug therapy: Secondary | ICD-10-CM | POA: Diagnosis not present

## 2023-09-30 ENCOUNTER — Other Ambulatory Visit: Payer: Self-pay

## 2023-09-30 ENCOUNTER — Other Ambulatory Visit: Payer: Self-pay | Admitting: Family Medicine

## 2023-09-30 MED ORDER — SILDENAFIL CITRATE 20 MG PO TABS: ORAL_TABLET | ORAL | 0 refills | Status: DC

## 2023-09-30 NOTE — Telephone Encounter (Signed)
 Communication  Reason for CRM: Patient is calling to follow up on a refill request for sildenafil  (REVATIO ) 20 MG tablet, I advised of the turnaround time; however, patient is out and would like for it to be expedited.   Rx sent.

## 2023-10-05 DIAGNOSIS — Z79899 Other long term (current) drug therapy: Secondary | ICD-10-CM | POA: Diagnosis not present

## 2023-10-07 DIAGNOSIS — Z860100 Personal history of colon polyps, unspecified: Secondary | ICD-10-CM | POA: Diagnosis not present

## 2023-10-11 ENCOUNTER — Telehealth: Payer: Self-pay

## 2023-10-11 NOTE — Telephone Encounter (Signed)
 Copied from CRM [id number]. Topic: Medical Record Request - Other >> Oct 07, 2023  2:26 PM Cody Conway wrote: Reason for CRM: Patient wants Medical record release to [facility] in [place], is planning on transfering care due to location

## 2023-10-19 DIAGNOSIS — R030 Elevated blood-pressure reading, without diagnosis of hypertension: Secondary | ICD-10-CM | POA: Diagnosis not present

## 2023-10-19 DIAGNOSIS — Z79899 Other long term (current) drug therapy: Secondary | ICD-10-CM | POA: Diagnosis not present

## 2023-10-19 DIAGNOSIS — R1115 Cyclical vomiting syndrome unrelated to migraine: Secondary | ICD-10-CM | POA: Diagnosis not present

## 2023-10-24 DIAGNOSIS — Z79899 Other long term (current) drug therapy: Secondary | ICD-10-CM | POA: Diagnosis not present

## 2023-11-03 DIAGNOSIS — Z1211 Encounter for screening for malignant neoplasm of colon: Secondary | ICD-10-CM | POA: Diagnosis not present

## 2023-11-03 DIAGNOSIS — Z860100 Personal history of colon polyps, unspecified: Secondary | ICD-10-CM | POA: Diagnosis not present

## 2023-11-03 DIAGNOSIS — K635 Polyp of colon: Secondary | ICD-10-CM | POA: Diagnosis not present

## 2023-11-18 DIAGNOSIS — R1115 Cyclical vomiting syndrome unrelated to migraine: Secondary | ICD-10-CM | POA: Diagnosis not present

## 2023-11-18 DIAGNOSIS — Z79899 Other long term (current) drug therapy: Secondary | ICD-10-CM | POA: Diagnosis not present

## 2023-11-18 DIAGNOSIS — R0602 Shortness of breath: Secondary | ICD-10-CM | POA: Diagnosis not present

## 2023-11-22 DIAGNOSIS — Z79899 Other long term (current) drug therapy: Secondary | ICD-10-CM | POA: Diagnosis not present

## 2023-12-02 DIAGNOSIS — R1115 Cyclical vomiting syndrome unrelated to migraine: Secondary | ICD-10-CM | POA: Diagnosis not present

## 2023-12-02 DIAGNOSIS — F17210 Nicotine dependence, cigarettes, uncomplicated: Secondary | ICD-10-CM | POA: Diagnosis not present

## 2023-12-02 DIAGNOSIS — Z860101 Personal history of adenomatous and serrated colon polyps: Secondary | ICD-10-CM | POA: Diagnosis not present

## 2023-12-02 DIAGNOSIS — R112 Nausea with vomiting, unspecified: Secondary | ICD-10-CM | POA: Diagnosis not present

## 2023-12-16 DIAGNOSIS — R1115 Cyclical vomiting syndrome unrelated to migraine: Secondary | ICD-10-CM | POA: Diagnosis not present

## 2023-12-16 DIAGNOSIS — Z79899 Other long term (current) drug therapy: Secondary | ICD-10-CM | POA: Diagnosis not present

## 2023-12-16 DIAGNOSIS — F17210 Nicotine dependence, cigarettes, uncomplicated: Secondary | ICD-10-CM | POA: Diagnosis not present

## 2023-12-16 DIAGNOSIS — R030 Elevated blood-pressure reading, without diagnosis of hypertension: Secondary | ICD-10-CM | POA: Diagnosis not present

## 2023-12-20 DIAGNOSIS — Z79899 Other long term (current) drug therapy: Secondary | ICD-10-CM | POA: Diagnosis not present

## 2024-01-06 DIAGNOSIS — Z1211 Encounter for screening for malignant neoplasm of colon: Secondary | ICD-10-CM | POA: Diagnosis not present

## 2024-01-06 DIAGNOSIS — R112 Nausea with vomiting, unspecified: Secondary | ICD-10-CM | POA: Diagnosis not present

## 2024-01-13 DIAGNOSIS — F17210 Nicotine dependence, cigarettes, uncomplicated: Secondary | ICD-10-CM | POA: Diagnosis not present

## 2024-01-13 DIAGNOSIS — R1115 Cyclical vomiting syndrome unrelated to migraine: Secondary | ICD-10-CM | POA: Diagnosis not present

## 2024-01-13 DIAGNOSIS — Z79899 Other long term (current) drug therapy: Secondary | ICD-10-CM | POA: Diagnosis not present

## 2024-01-18 ENCOUNTER — Ambulatory Visit: Payer: 59

## 2024-01-18 DIAGNOSIS — Z79899 Other long term (current) drug therapy: Secondary | ICD-10-CM | POA: Diagnosis not present
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
## 2024-02-07 ENCOUNTER — Encounter: Payer: Self-pay | Admitting: Gastroenterology

## 2024-02-20 ENCOUNTER — Ambulatory Visit (INDEPENDENT_AMBULATORY_CARE_PROVIDER_SITE_OTHER): Admitting: Family Medicine

## 2024-02-20 ENCOUNTER — Other Ambulatory Visit: Payer: Self-pay | Admitting: Family Medicine

## 2024-02-20 ENCOUNTER — Encounter: Payer: Self-pay | Admitting: Family Medicine

## 2024-02-20 VITALS — BP 128/80 | HR 95 | Temp 97.6°F | Ht 68.75 in | Wt 191.0 lb

## 2024-02-20 DIAGNOSIS — R1115 Cyclical vomiting syndrome unrelated to migraine: Secondary | ICD-10-CM

## 2024-02-20 DIAGNOSIS — Z125 Encounter for screening for malignant neoplasm of prostate: Secondary | ICD-10-CM

## 2024-02-20 DIAGNOSIS — E7800 Pure hypercholesterolemia, unspecified: Secondary | ICD-10-CM | POA: Diagnosis not present

## 2024-02-20 DIAGNOSIS — Z0000 Encounter for general adult medical examination without abnormal findings: Secondary | ICD-10-CM

## 2024-02-20 DIAGNOSIS — Z87891 Personal history of nicotine dependence: Secondary | ICD-10-CM | POA: Diagnosis not present

## 2024-02-20 MED ORDER — SILDENAFIL CITRATE 20 MG PO TABS: ORAL_TABLET | ORAL | 3 refills | Status: DC

## 2024-02-20 MED ORDER — AMITRIPTYLINE HCL 25 MG PO TABS: 25.0000 mg | ORAL_TABLET | Freq: Every day | ORAL | 3 refills | Status: AC
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
## 2024-02-20 NOTE — Patient Instructions (Signed)
 Dr. Legrand with [facility] Phone: [phone]

## 2024-02-21 LAB — PSA: PSA: 1.19 ng/mL (ref 0.10–4.00)

## 2024-02-22 ENCOUNTER — Ambulatory Visit: Payer: Self-pay | Admitting: Family Medicine

## 2024-02-22 DIAGNOSIS — I2510 Atherosclerotic heart disease of native coronary artery without angina pectoris: Secondary | ICD-10-CM

## 2024-02-29 ENCOUNTER — Ambulatory Visit

## 2024-02-29 DIAGNOSIS — Z122 Encounter for screening for malignant neoplasm of respiratory organs: Secondary | ICD-10-CM | POA: Diagnosis not present

## 2024-02-29 DIAGNOSIS — Z87891 Personal history of nicotine dependence: Secondary | ICD-10-CM

## 2024-02-29 DIAGNOSIS — F17210 Nicotine dependence, cigarettes, uncomplicated: Secondary | ICD-10-CM

## 2024-03-04 ENCOUNTER — Encounter: Payer: Self-pay | Admitting: Family Medicine

## 2024-03-05 MED ORDER — ATORVASTATIN CALCIUM 20 MG PO TABS: 20.0000 mg | ORAL_TABLET | Freq: Every day | ORAL | 2 refills | Status: DC

## 2024-03-14 DIAGNOSIS — F17210 Nicotine dependence, cigarettes, uncomplicated: Secondary | ICD-10-CM | POA: Diagnosis not present

## 2024-03-14 DIAGNOSIS — R1115 Cyclical vomiting syndrome unrelated to migraine: Secondary | ICD-10-CM | POA: Diagnosis not present

## 2024-03-14 DIAGNOSIS — Z79899 Other long term (current) drug therapy: Secondary | ICD-10-CM | POA: Diagnosis not present

## 2024-03-16 DIAGNOSIS — Z79899 Other long term (current) drug therapy: Secondary | ICD-10-CM | POA: Diagnosis not present

## 2024-03-22 ENCOUNTER — Ambulatory Visit: Payer: Self-pay

## 2024-03-22 NOTE — Telephone Encounter (Addendum)
 FYI Only or Action Required?: Action required by provider: medication refill request and clinical question for provider.  Patient was last seen in primary care on 02/20/2024 by McGowen, Aleene DEL, MD.  Called Nurse Triage reporting Shortness of Breath.  Symptoms began several weeks ago.  Interventions attempted: Nothing.  Symptoms are: unchanged.  Triage Disposition: See PCP When Office is Open (Within 3 Days)  Patient/caregiver understands and will follow disposition?: No, wishes to speak with PCP   Copied from CRM #[id number]. Topic: Clinical - Red Word Triage >> Mar 22, 2024 12:04 PM Viola F wrote: Red Word that prompted transfer to Nurse Triage: Patient having shortness of breath and would like an albuterol inhaler Reason for Disposition  [1] MODERATE longstanding difficulty breathing (e.g., speaks in phrases, SOB even at rest, pulse 100-120) AND [2] SAME as normal  Answer Assessment - Initial Assessment Questions Patient declines appt and reports already seen MD and CT scan.  Patient requesting inhaler for sob and call back.  1. RESPIRATORY STATUS: Describe your breathing? (e.g., wheezing, shortness of breath, unable to speak, severe coughing)      Denies currently, in the evening have intermittent sob with exertion 2. ONSET: When did this breathing problem begin?      Months ago 3. PATTERN Does the difficult breathing come and go, or has it been constant since it started?      Comes and goes 4. SEVERITY: How bad is your breathing? (e.g., mild, moderate, severe)      mild 6. CARDIAC HISTORY: Do you have any history of heart disease? (e.g., heart attack, angina, bypass surgery, angioplasty)      no 7. LUNG HISTORY: Do you have any history of lung disease?  (e.g., pulmonary embolus, asthma, emphysema)     emphysema  9. OTHER SYMPTOMS: Do you have any other symptoms? (e.g., chest pain, cough, dizziness, fever, runny nose) Denies dizziness, chest pain,  sob  Protocols used: Breathing Difficulty-A-AH Advised UC/ED if symptoms worsen.

## 2024-04-04 ENCOUNTER — Ambulatory Visit (INDEPENDENT_AMBULATORY_CARE_PROVIDER_SITE_OTHER)

## 2024-04-04 VITALS — Ht 68.75 in | Wt 191.0 lb

## 2024-04-04 DIAGNOSIS — Z0000 Encounter for general adult medical examination without abnormal findings: Secondary | ICD-10-CM | POA: Diagnosis not present

## 2024-04-04 NOTE — Patient Instructions (Addendum)
 Cody Conway,  Thank you for taking the time for your Medicare Wellness Visit. I appreciate your continued commitment to your health goals. Please review the care plan we discussed, and feel free to reach out if I can assist you further.  Please note that Annual Wellness Visits do not include a physical exam. Some assessments may be limited, especially if the visit was conducted virtually. If needed, we may recommend an in-person follow-up with your provider.  Ongoing Care Seeing your primary care provider every 3 to 6 months helps us  monitor your health and provide consistent, personalized care. Last office visit on 02/20/2024.  Keep up the good work.  Referrals If a referral was made during today's visit and you haven't received any updates within two weeks, please contact the referred provider directly to check on the status.  Recommended Screenings:  Health Maintenance  Topic Date Due   Pneumococcal Vaccine for age over 105 (1 of 2 - PCV) Never done   Zoster (Shingles) Vaccine (1 of 2) Never done   Hepatitis B Vaccine (3 of 3 - 19+ 3-dose series) 06/03/2012   Flu Shot  12/23/2023   Medicare Annual Wellness Visit  04/04/2025   DTaP/Tdap/Td vaccine (3 - Td or Tdap) 12/08/2025   Colon Cancer Screening  11/03/2026   Hepatitis C Screening  Completed   HIV Screening  Completed   HPV Vaccine  Aged Out   Meningitis B Vaccine  Aged Out   Screening for Lung Cancer  Discontinued   COVID-19 Vaccine  Discontinued       04/04/2024    8:54 AM  Advanced Directives  Does Patient Have a Medical Advance Directive? No    Vision: Annual vision screenings are recommended for early detection of glaucoma, cataracts, and diabetic retinopathy. These exams can also reveal signs of chronic conditions such as diabetes and high blood pressure.  Dental: Annual dental screenings help detect early signs of oral cancer, gum disease, and other conditions linked to overall health, including heart disease and  diabetes.  Please see the attached documents for additional preventive care recommendations.
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
## 2024-05-07 ENCOUNTER — Other Ambulatory Visit

## 2024-05-08 ENCOUNTER — Telehealth: Payer: Self-pay

## 2024-05-08 NOTE — Telephone Encounter (Signed)
 Copied from CRM [id number]. Topic: Clinical - Medical Advice >> May 08, 2024 12:24 PM Dedra B wrote: Reason for CRM: Pt is scheduled to have labs (lipid, AST, ALT) done tomorrow and wants to know if he should be fasting. Pls call pt and let him know.

## 2024-05-08 NOTE — Telephone Encounter (Signed)
 Left detailed message advising pt that he needs to be fasting for appt tomorrow.

## 2024-05-09 ENCOUNTER — Other Ambulatory Visit

## 2024-05-10 ENCOUNTER — Other Ambulatory Visit: Payer: Self-pay

## 2024-05-10 MED ORDER — SILDENAFIL CITRATE 20 MG PO TABS: ORAL_TABLET | ORAL | 3 refills | Status: AC

## 2024-05-11 ENCOUNTER — Other Ambulatory Visit

## 2024-05-11 DIAGNOSIS — I2510 Atherosclerotic heart disease of native coronary artery without angina pectoris: Secondary | ICD-10-CM

## 2024-05-11 LAB — AST: AST: 17 U/L (ref 5–37)

## 2024-05-11 LAB — LIPID PANEL
Cholesterol: 157 mg/dL (ref 28–200)
HDL: 41.2 mg/dL
LDL Cholesterol: 88 mg/dL (ref 10–99)
NonHDL: 116.2
Total CHOL/HDL Ratio: 4
Triglycerides: 141 mg/dL (ref 10.0–149.0)
VLDL: 28.2 mg/dL (ref 0.0–40.0)

## 2024-05-11 LAB — ALT: ALT: 24 U/L (ref 3–53)

## 2024-05-14 ENCOUNTER — Ambulatory Visit: Payer: Self-pay | Admitting: Family Medicine

## 2024-05-14 DIAGNOSIS — I2510 Atherosclerotic heart disease of native coronary artery without angina pectoris: Secondary | ICD-10-CM

## 2024-05-14 MED ORDER — ATORVASTATIN CALCIUM 20 MG PO TABS: 20.0000 mg | ORAL_TABLET | Freq: Every day | ORAL | 2 refills | Status: DC
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
## 2024-06-04 ENCOUNTER — Ambulatory Visit: Admitting: Cardiology

## 2024-06-04 ENCOUNTER — Encounter: Payer: Self-pay | Admitting: Cardiology

## 2024-06-04 VITALS — BP 116/80 | HR 78 | Ht 67.5 in | Wt 204.0 lb

## 2024-06-04 DIAGNOSIS — R0609 Other forms of dyspnea: Secondary | ICD-10-CM | POA: Diagnosis not present

## 2024-06-04 DIAGNOSIS — I2510 Atherosclerotic heart disease of native coronary artery without angina pectoris: Secondary | ICD-10-CM | POA: Diagnosis not present

## 2024-06-04 DIAGNOSIS — E7800 Pure hypercholesterolemia, unspecified: Secondary | ICD-10-CM | POA: Diagnosis not present

## 2024-06-04 MED ORDER — ATORVASTATIN CALCIUM 80 MG PO TABS: 80.0000 mg | ORAL_TABLET | Freq: Every day | ORAL | 3 refills | Status: AC

## 2024-06-04 NOTE — Patient Instructions (Addendum)
 Medication Instructions:   INCREASE ATORVASTATIN  TO 80 MG ONCE DAILY=4 OF THE 20 MG TABLETS ONCE DAILY  *If you need a refill on your cardiac medications before your next appointment, please call your pharmacy*  Lab Work:  Your physician recommends that you return for lab work in: 8 [facility]  If you have labs (blood work) drawn today and your tests are completely normal, you will receive your results only by: MyChart Message (if you have MyChart) OR A paper copy in the mail If you have any lab test that is abnormal or we need to change your treatment, we will call you to review the results.  Testing/Procedures:  CARDIAC PET- Your physician has requested that you have a Cardiac Pet Stress Test.   This testing is completed at [facility] ([address] [place], [place] [address]) or [facility] ([address], [place], [place]). Please arrive 30 minutes prior to your scheduled time.  The schedulers will call you to get this scheduled. Please follow further testing instructions below.   Follow-Up: At [facility], you and your health needs are our priority.  As part of our continuing mission to provide you with exceptional heart care, our providers are all part of one team.  This team includes your primary Cardiologist (physician) and Advanced Practice Providers or APPs (Physician Assistants and Nurse Practitioners) who all work together to provide you with the care you need, when you need it.  Your next appointment:   12 month(s)  Provider:   REDELL SHALLOW MD    Other Instructions     Please report to Radiology at the [facility] Main Entrance 30 minutes early for your test.  [address] [place], [place] [address]             How to Prepare for Your Cardiac PET/CT Stress Test:  Nothing to eat or drink, except water, 3 hours prior to arrival time.  NO caffeine/decaffeinated products, or  chocolate 12 hours prior to arrival. (Please note decaffeinated beverages (teas/coffees) still contain caffeine).  If you have caffeine within 12 hours prior, the test will need to be rescheduled.  Medication instructions: Do not take erectile dysfunction medications for 72 hours prior to test (sildenafil , tadalafil )  You may take your remaining medications with water.  NO perfume, cologne or lotion on chest or abdomen area.   Total time is 1 to 2 hours; you may want to bring reading material for the waiting time.  In preparation for your appointment, medication and supplies will be purchased.  Appointment availability is limited, so if you need to cancel or reschedule, please call the Radiology Department Scheduler at [phone] 24 hours in advance to avoid a cancellation fee of $100.00  What to Expect When you Arrive:  Once you arrive and check in for your appointment, you will be taken to a preparation room within the Radiology Department.  A technologist or Nurse will obtain your medical history, verify that you are correctly prepped for the exam, and explain the procedure.  Afterwards, an IV will be started in your arm and electrodes will be placed on your skin for EKG monitoring during the stress portion of the exam. Then you will be escorted to the PET/CT scanner.  There, staff will get you positioned on the scanner and obtain a blood pressure and EKG.  During the exam, you will continue to be connected to the EKG and blood pressure machines.  A small, safe amount of a radioactive tracer will be injected in your IV to obtain a series of pictures of your heart along with an injection of a stress agent.    After your Exam:  It is recommended that you eat a meal and drink a caffeinated beverage to counter act any effects of the stress agent.  Drink plenty of fluids for the remainder of the day and urinate frequently for the first couple of hours after the exam.  Your doctor will inform you  of your test results within 7-10 business days.  For more information and frequently asked questions, please visit our website: [email or web address]  For questions about your test or how to prepare for your test, please call: Cardiac Imaging Nurse Navigators Office: [phone]

## 2024-06-11 ENCOUNTER — Encounter (HOSPITAL_COMMUNITY): Payer: Self-pay

## 2024-06-12 ENCOUNTER — Telehealth (HOSPITAL_COMMUNITY): Payer: Self-pay | Admitting: *Deleted

## 2024-06-12 NOTE — Telephone Encounter (Signed)
 Reaching out to patient to offer assistance regarding upcoming cardiac imaging study; pt verbalizes understanding of appt date/time, parking situation and where to check in, pre-test NPO status and verified current allergies; name and call back number provided for further questions should they arise  Chantal Requena RN Navigator Cardiac Imaging [facility] [phone] office [phone] cell  Patient aware to avoid caffeine 12 hours prior to his cardiac PET study.

## 2024-06-13 ENCOUNTER — Ambulatory Visit: Payer: Self-pay | Admitting: Cardiology

## 2024-06-13 ENCOUNTER — Ambulatory Visit (HOSPITAL_COMMUNITY)
Admission: RE | Admit: 2024-06-13 | Discharge: 2024-06-13 | Disposition: A | Discharge status: Home / Self Care | Source: Ambulatory Visit | Attending: Cardiology | Admitting: Cardiology

## 2024-06-13 DIAGNOSIS — I2510 Atherosclerotic heart disease of native coronary artery without angina pectoris: Secondary | ICD-10-CM | POA: Diagnosis not present

## 2024-06-13 LAB — NM PET CT CARDIAC PERFUSION MULTI W/ABSOLUTE BLOODFLOW
LV dias vol: 125 mL (ref 62–150)
LV sys vol: 67 mL
MBFR: 2.08
Nuc Rest EF: 46 %
Nuc Stress EF: 46 %
Rest MBF: 0.53 ml/g/min
Rest Nuclear Isotope Dose: 23.6 mCi
ST Depression (mm): 0 mm
Stress MBF: 1.1 ml/g/min
Stress Nuclear Isotope Dose: 24.1 mCi

## 2024-06-13 MED ORDER — REGADENOSON 0.4 MG/5ML IV SOLN
INTRAVENOUS | Status: AC
  Filled 2024-06-13: qty 5

## 2024-06-13 MED ORDER — REGADENOSON 0.4 MG/5ML IV SOLN
0.4000 mg | Freq: Once | INTRAVENOUS | Status: AC
  Administered 2024-06-13: 0.4 mg via INTRAVENOUS

## 2024-06-13 MED ORDER — RUBIDIUM RB82 GENERATOR (RUBYFILL)
24.1000 | PACK | Freq: Once | INTRAVENOUS | Status: AC
  Administered 2024-06-13: 24.1 via INTRAVENOUS

## 2024-06-13 MED ORDER — RUBIDIUM RB82 GENERATOR (RUBYFILL)
23.6000 | PACK | Freq: Once | INTRAVENOUS | Status: AC
  Administered 2024-06-13: 23.6 via INTRAVENOUS

## 2024-06-13 NOTE — Progress Notes (Unsigned)
 "  Cardiology Office Note:    Date:  06/13/2024   ID:  Cody Conway, DOB [birth year], MRN [id number]  PCP:  Candise Aleene DEL, MD  Cardiologist:  None { Click to update primary MD,subspecialty MD or APP then REFRESH:1}    Referring MD: Candise Aleene DEL, MD   Chief Complaint: follow-up of abnormal stress test  History of Present Illness:    Cody Conway is a 54 y.o. male with a history of coronary artery calcifications and recent abnormal cardiac PET stress test on 06/13/2024, emphysema, hyperlipidemia, ADHD, anxiety/ depression, and prior tobacco abuse who is followed by Dr. Pietro and presents today for follow-up of abnormal stress test.   Patient was recently referred to Cardiology and seen by Dr. Pietro on 06/04/2024 for further evaluation of coronary artery calcifications. Prior chest CT in 02/2024 for lung cancer screening showed age advanced three vessel coronary artery calcifications and aortic atherosclerosis. He reported dyspnea with more vigorous activity but no chest pain. Cardiac PET stress test was ordered for further evaluation which was high risk and showed a large area of ischemic in the LAD territory with abnormal localized myocardial blood flow reserve and visual TID. Therefore, this visit was arranged for further evaluation.   ***  Coronary Artery Calcifications Abnormal Stress Test Chest CT in 02/2024 for lung cancer screening showed age advanced three vessel coronary artery calcifications and aortic atherosclerosis. Recent cardiac PET stress test on 06/13/2024 was high risk and showed a large area of ischemic in the LAD territory with abnormal localized myocardial blood flow reserve and visual TID. - *** - Continue Lipitor 80mg  daily. - Will start Aspirin 81mg  daily.  - Will provide a prescription of PRN sublingual Nitroglycerin.  - Will arrange left cardiac catheterization. Will check pre-procedural labs today (CBC and BMET). ***  Hyperlipidemia Lipid panel in  04/2024: Total Cholesterol 157, Triglycerides 141, HDL 41.20, LDL 88. LDL goal <55.  - Lipitor was increased to 80mg  daily at visit earlier this month. Continue.  - Plan is to repeat lipid panel and LFTs in 07/2024.   Prior Tobacco Abuse Quit smoking in 02/2024.    EKGs/Labs/Other Studies Reviewed:    The following studies were reviewed:  Cardiac PET Stress Test 06/13/2024:   LV perfusion is abnormal. There is evidence of ischemia. There is no evidence of infarction. Defect 1: There is a large defect with severe reduction in uptake present in the apical to mid anterior, septal and apex location(s) that is reversible. There is abnormal wall motion in the defect area. Consistent with ischemia. The defect is consistent with abnormal perfusion in the LAD territory.   Rest left ventricular function is abnormal. Rest global function is mildly reduced. There was a single regional abnormality. Rest EF: 46%. Stress left ventricular function is abnormal. Stress global function is mildly reduced. There was a single regional abnormality. Stress EF: 46%. End diastolic cavity size is mildly enlarged. End systolic cavity size is mildly enlarged.   Myocardial blood flow was computed to be 0.79ml/g/min at rest and 1.10ml/g/min at stress. Global myocardial blood flow reserve was 2.08 and was normal. Reserve abnormal in area of LAD ischemia at 1.22   Coronary calcium  was present on the attenuation correction CT images. Moderate coronary calcifications were present. Coronary calcifications were present in the left anterior descending artery distribution(s).   Findings are consistent with ischemia. The study is high risk.   Large area of ischemia in the LAD territory with abnormal localized MBFR 1.22  and visual TID   EKG:  EKG *** ordered today. EKG personally reviewed and demonstrates ***.  Recent Labs: 05/11/2024: ALT 24  Recent Lipid Panel    Component Value Date/Time   CHOL 157 05/11/2024 0812   TRIG 141.0  05/11/2024 0812   HDL 41.20 05/11/2024 0812   CHOLHDL 4 05/11/2024 0812   VLDL 28.2 05/11/2024 0812   LDLCALC 88 05/11/2024 0812    Physical Exam:    Vital Signs: There were no vitals taken for this visit.    Wt Readings from Last 3 Encounters:  06/04/24 203 lb 15.7 oz (92.5 kg)  04/04/24 191 lb (86.6 kg)  02/20/24 191 lb (86.6 kg)     General: 54 y.o. male in no acute distress. HEENT: Normocephalic and atraumatic. Sclera clear.  Neck: Supple. No carotid bruits. No JVD. Heart: *** RRR. Distinct S1 and S2. No murmurs, gallops, or rubs.  Lungs: No increased work of breathing. Clear to ausculation bilaterally. No wheezes, rhonchi, or rales.  Abdomen: Soft, non-distended, and non-tender to palpation.  Extremities: No lower extremity edema.  Radial and distal pedal pulses 2+ and equal bilaterally. Skin: Warm and dry. Neuro: No focal deficits. Psych: Normal affect. Responds appropriately.   Assessment:    No diagnosis found.  Plan:     Disposition: Follow up in ***   Signed, Aline FORBES Door, PA-C  06/13/2024 5:36 PM    [facility] "

## 2024-06-15 ENCOUNTER — Telehealth: Payer: Self-pay | Admitting: Cardiology

## 2024-06-15 ENCOUNTER — Encounter: Payer: Self-pay | Admitting: Emergency Medicine

## 2024-06-15 ENCOUNTER — Ambulatory Visit: Attending: Emergency Medicine | Admitting: Emergency Medicine

## 2024-06-15 VITALS — BP 112/74 | HR 81 | Ht 67.0 in | Wt 200.0 lb

## 2024-06-15 DIAGNOSIS — R9439 Abnormal result of other cardiovascular function study: Secondary | ICD-10-CM

## 2024-06-15 DIAGNOSIS — R0609 Other forms of dyspnea: Secondary | ICD-10-CM | POA: Diagnosis not present

## 2024-06-15 DIAGNOSIS — R0683 Snoring: Secondary | ICD-10-CM

## 2024-06-15 DIAGNOSIS — I2510 Atherosclerotic heart disease of native coronary artery without angina pectoris: Secondary | ICD-10-CM | POA: Diagnosis not present

## 2024-06-15 MED ORDER — NITROGLYCERIN 0.4 MG SL SUBL: 0.4000 mg | SUBLINGUAL_TABLET | SUBLINGUAL | 3 refills | Status: AC | PRN

## 2024-06-15 MED ORDER — ASPIRIN 81 MG PO TBEC: 81.0000 mg | DELAYED_RELEASE_TABLET | Freq: Every day | ORAL | Status: AC

## 2024-06-15 NOTE — Telephone Encounter (Signed)
 Patient is calling to see if he can get his results over the phone due to the coming up weather conditions. Please advise

## 2024-06-15 NOTE — Telephone Encounter (Signed)
 Spoke with pt. Pt is worried about results and the weather Monday and stated with his anxiety he did not feel like he could drive. Told pt that they would like to see him in office to go over results and next steps. Pt stated he is very concerned and has anxiety about this as well as in general. Set him up with same day appt to see Miriam Shams to review results. Pt stated understanding. Kenzie and covering have been notified.
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
## 2024-06-15 NOTE — Patient Instructions (Addendum)
 Medication Instructions:  START Nitroglycerin 0.4mg  Take 1 as needed for emergency chest pain. Take first dose for emergency chest pain; WAIT 5 minutes and then take 2nd dose. IF still having pain if still having chest pain CALL 911 wait an additional 5 minutes before taking final dose. Do not take more than 3 doses in a day. DO NOT TAKE 72 HOURS PRIOR TO OR 72 HOURS AFTER TAKING ERECTILE DYSFUNCTION MEDIATIONS.  START Aspirin 81 mg. Take one (1) tablet by mouth once daily.  *If you need a refill on your cardiac medications before your next appointment, please call your pharmacy*  Lab Work: CBC, BMP today If you have labs (blood work) drawn today and your tests are completely normal, you will receive your results only by: MyChart Message (if you have MyChart) OR A paper copy in the mail If you have any lab test that is abnormal or we need to change your treatment, we will call you to review the results.  Testing/Procedures: Left Heart Catheterization- Your physician has requested that you have a cardiac catheterization. Cardiac catheterization is used to diagnose and/or treat various heart conditions. Doctors may recommend this procedure for a number of different reasons. The most common reason is to evaluate chest pain. Chest pain can be a symptom of coronary artery disease (CAD), and cardiac catheterization can show whether plaque is narrowing or blocking your hearts arteries. This procedure is also used to evaluate the valves, as well as measure the blood flow and oxygen levels in different parts of your heart. For further information please visit [email or web address] Please follow instruction sheet, as given.   Follow-Up: At [facility], you and your health needs are our priority.  As part of our continuing mission to provide you with exceptional heart care, our providers are all part of one team.  This team includes your primary Cardiologist (physician) and Advanced Practice  Providers or APPs (Physician Assistants and Nurse Practitioners) who all work together to provide you with the care you need, when you need it.  Your next appointment:   2 week(s) after Cath  Provider:   One of our Advanced Practice Providers (APPs): Morse Clause, PA-C  Lamarr Satterfield, NP Miriam Shams, NP  Olivia Pavy, PA-C Josefa Beauvais, NP  Leontine Salen, PA-C Orren Fabry, PA-C  [place], PA-C Ernest Dick, NP  Damien Braver, NP Jon Hails, PA-C  Waddell Donath, PA-C    Dayna Dunn, PA-C  Scott Weaver, PA-C Lum Louis, NP Katlyn West, NP Callie Goodrich, PA-C  Xika Zhao, NP Sheng Haley, PA-C    Kathleen Johnson, PA-C   Then, Redell Shallow, MD will plan to see you again in 2 month(s).    We recommend signing up for the patient portal called MyChart.  Sign up information is provided on this After Visit Summary.  MyChart is used to connect with patients for Virtual Visits (Telemedicine).  Patients are able to view lab/test results, encounter notes, upcoming appointments, etc.  Non-urgent messages can be sent to your provider as well.   To learn more about what you can do with MyChart, go to [email or web address].   Other Instructions       Cardiac/Peripheral Catheterization   You are scheduled for a Cardiac Catheterization on Thursday, January 29 with Dr. Lonni End.  1. Please arrive at the [facility] (Main Entrance A) at [facility]: [address] [place], [place] [address] at 7:00 AM (This time is 2 hour(s) before your procedure to ensure your  preparation).   Free valet parking service is available. You will check in at ADMITTING. The support person will be asked to wait in the waiting room.  It is OK to have someone drop you off and come back when you are ready to be discharged.        Special note: Every effort is made to have your procedure done on time. Please understand that emergencies sometimes delay scheduled procedures.  2. Diet:  Nothing to eat after midnight.  3. Hydration:You need to be well hydrated before your procedure. On January 29, you may drink approved liquids (see below) until 2 hours before the procedure, with 16 oz of water as your last intake.   List of approved liquids water, clear juice, clear tea, black coffee, fruit juices, non-citric and without pulp, carbonated beverages, Gatorade, Kool -Aid, plain Jello-O and plain ice popsicles.  4. Labs: You will need to have blood drawn on Friday, January 23 at [facility] (1st Floor), [address], [place], [place] [address]. You do not need to be fasting.  5. Medication instructions in preparation for your procedure:   Contrast Allergy: No  On the morning of your procedure, take Aspirin 81 mg and any morning medicines NOT listed above.  You may use sips of water.  6. Plan to go home the same day, you will only stay overnight if medically necessary. 7. You MUST have a responsible adult to drive you home. 8. An adult MUST be with you the first 24 hours after you arrive home. 9. Bring a current list of your medications, and the last time and date medication taken. 10. Bring ID and current insurance cards. 11.Please wear clothes that are easy to get on and off and wear slip-on shoes.  Thank you for allowing us  to care for you!   -- [facility] Cardiovascular services

## 2024-06-16 LAB — LIPID PANEL
Chol/HDL Ratio: 2.9 ratio (ref 0.0–5.0)
Cholesterol, Total: 146 mg/dL (ref 100–199)
HDL: 50 mg/dL
LDL Chol Calc (NIH): 77 mg/dL (ref 0–99)
Triglycerides: 106 mg/dL (ref 0–149)
VLDL Cholesterol Cal: 19 mg/dL (ref 5–40)

## 2024-06-16 LAB — HEPATIC FUNCTION PANEL
ALT: 24 [IU]/L (ref 0–44)
AST: 21 [IU]/L (ref 0–40)
Albumin: 4.2 g/dL (ref 3.8–4.9)
Alkaline Phosphatase: 114 [IU]/L (ref 47–123)
Bilirubin Total: 0.4 mg/dL (ref 0.0–1.2)
Bilirubin, Direct: 0.15 mg/dL (ref 0.00–0.40)
Total Protein: 6.4 g/dL (ref 6.0–8.5)

## 2024-06-18 ENCOUNTER — Ambulatory Visit: Admitting: Student

## 2024-06-19 ENCOUNTER — Telehealth: Payer: Self-pay | Admitting: *Deleted

## 2024-06-19 NOTE — Telephone Encounter (Signed)
 Cardiac Catheterization scheduled at [facility] for: Thursday June 21, 2024 9 AM Arrival time [facility] Main Entrance A at: 7 AM  Diet: -Nothing to eat after midnight.  Hydration: -May drink clear liquids until 2 hours before the procedure.  Approved liquids: Water , clear tea, black coffee, fruit juices-non-citric and without pulp,Gatorade, plain Jello/popsicles.   -Please drink 16 oz of water  2 hours before procedure.  Medication instructions: -Usual morning medications can be taken including aspirin  81 mg.  Plan to go home the same day, you will only stay overnight if medically necessary.  You must have responsible adult to drive you home.  Someone must be with you the first 24 hours after you arrive home.  Reviewed procedure instructions with patient.

## 2024-06-21 ENCOUNTER — Other Ambulatory Visit: Payer: Self-pay

## 2024-06-21 ENCOUNTER — Other Ambulatory Visit (HOSPITAL_COMMUNITY): Payer: Self-pay

## 2024-06-21 ENCOUNTER — Encounter: Payer: Self-pay | Admitting: Family Medicine

## 2024-06-21 ENCOUNTER — Ambulatory Visit (HOSPITAL_COMMUNITY)
Admission: RE | Admit: 2024-06-21 | Discharge: 2024-06-21 | Disposition: A | Discharge status: Home / Self Care | Attending: Internal Medicine | Admitting: Internal Medicine

## 2024-06-21 ENCOUNTER — Ambulatory Visit: Payer: Self-pay | Admitting: Family Medicine

## 2024-06-21 ENCOUNTER — Encounter (HOSPITAL_COMMUNITY): Admission: RE | Disposition: A | Payer: Self-pay | Attending: Internal Medicine

## 2024-06-21 DIAGNOSIS — F419 Anxiety disorder, unspecified: Secondary | ICD-10-CM | POA: Insufficient documentation

## 2024-06-21 DIAGNOSIS — R0609 Other forms of dyspnea: Secondary | ICD-10-CM | POA: Diagnosis not present

## 2024-06-21 DIAGNOSIS — I2510 Atherosclerotic heart disease of native coronary artery without angina pectoris: Secondary | ICD-10-CM | POA: Insufficient documentation

## 2024-06-21 DIAGNOSIS — Z8249 Family history of ischemic heart disease and other diseases of the circulatory system: Secondary | ICD-10-CM | POA: Diagnosis not present

## 2024-06-21 DIAGNOSIS — R9439 Abnormal result of other cardiovascular function study: Secondary | ICD-10-CM | POA: Insufficient documentation

## 2024-06-21 DIAGNOSIS — Z7982 Long term (current) use of aspirin: Secondary | ICD-10-CM | POA: Insufficient documentation

## 2024-06-21 DIAGNOSIS — Z955 Presence of coronary angioplasty implant and graft: Secondary | ICD-10-CM

## 2024-06-21 DIAGNOSIS — Z87891 Personal history of nicotine dependence: Secondary | ICD-10-CM | POA: Diagnosis not present

## 2024-06-21 DIAGNOSIS — Z79899 Other long term (current) drug therapy: Secondary | ICD-10-CM | POA: Diagnosis not present

## 2024-06-21 LAB — CBC
HCT: 39.4 % (ref 39.0–52.0)
Hemoglobin: 13.7 g/dL (ref 13.0–17.0)
MCH: 32.9 pg (ref 26.0–34.0)
MCHC: 34.8 g/dL (ref 30.0–36.0)
MCV: 94.5 fL (ref 80.0–100.0)
Platelets: 280 10*3/uL (ref 150–400)
RBC: 4.17 MIL/uL — ABNORMAL LOW (ref 4.22–5.81)
RDW: 13 % (ref 11.5–15.5)
WBC: 8.2 10*3/uL (ref 4.0–10.5)
nRBC: 0 % (ref 0.0–0.2)

## 2024-06-21 LAB — POCT ACTIVATED CLOTTING TIME
Activated Clotting Time: 271 s
Activated Clotting Time: 250 s
Activated Clotting Time: 271 s
Activated Clotting Time: 342 s

## 2024-06-21 LAB — BASIC METABOLIC PANEL WITH GFR
Anion gap: 10 (ref 5–15)
BUN: 8 mg/dL (ref 6–20)
CO2: 28 mmol/L (ref 22–32)
Calcium: 8.7 mg/dL — ABNORMAL LOW (ref 8.9–10.3)
Chloride: 101 mmol/L (ref 98–111)
Creatinine, Ser: 0.86 mg/dL (ref 0.61–1.24)
GFR, Estimated: >60 mL/min
Glucose, Bld: 117 mg/dL — ABNORMAL HIGH (ref 70–99)
Potassium: 3.7 mmol/L (ref 3.5–5.1)
Sodium: 138 mmol/L (ref 135–145)

## 2024-06-21 MED ORDER — CLOPIDOGREL BISULFATE 300 MG PO TABS
ORAL_TABLET | ORAL | Status: AC
  Filled 2024-06-21: qty 2

## 2024-06-21 MED ORDER — VERAPAMIL HCL 2.5 MG/ML IV SOLN
INTRAVENOUS | Status: DC | PRN
  Administered 2024-06-21: 10 mL via INTRA_ARTERIAL

## 2024-06-21 MED ORDER — HEPARIN SODIUM (PORCINE) 1000 UNIT/ML IJ SOLN
INTRAMUSCULAR | Status: AC
  Filled 2024-06-21: qty 10

## 2024-06-21 MED ORDER — CLOPIDOGREL BISULFATE 300 MG PO TABS
ORAL_TABLET | ORAL | Status: DC | PRN
  Administered 2024-06-21: 600 mg via ORAL

## 2024-06-21 MED ORDER — MIDAZOLAM HCL (PF) 2 MG/2ML IJ SOLN
INTRAMUSCULAR | Status: DC | PRN
  Administered 2024-06-21: 2 mg via INTRAVENOUS

## 2024-06-21 MED ORDER — VERAPAMIL HCL 2.5 MG/ML IV SOLN
INTRAVENOUS | Status: AC
  Filled 2024-06-21: qty 2

## 2024-06-21 MED ORDER — SODIUM CHLORIDE 0.9 % IV SOLN: 250.0000 mL | INTRAVENOUS | Status: DC | PRN

## 2024-06-21 MED ORDER — NITROGLYCERIN 1 MG/10 ML FOR IR/CATH LAB
INTRA_ARTERIAL | Status: DC | PRN
  Administered 2024-06-21 (×3): 100 ug

## 2024-06-21 MED ORDER — ASPIRIN 81 MG PO CHEW: 81.0000 mg | CHEWABLE_TABLET | Freq: Every day | ORAL | Status: DC

## 2024-06-21 MED ORDER — SODIUM CHLORIDE 0.9% FLUSH: 3.0000 mL | Freq: Two times a day (BID) | INTRAVENOUS | Status: DC

## 2024-06-21 MED ORDER — HEPARIN SODIUM (PORCINE) 1000 UNIT/ML IJ SOLN
INTRAMUSCULAR | Status: DC | PRN
  Administered 2024-06-21 (×2): 4500 [IU] via INTRAVENOUS
  Administered 2024-06-21 (×2): 2000 [IU] via INTRAVENOUS
  Administered 2024-06-21: 3000 [IU] via INTRAVENOUS

## 2024-06-21 MED ORDER — PANTOPRAZOLE SODIUM 20 MG PO TBEC
20.0000 mg | DELAYED_RELEASE_TABLET | Freq: Every day | ORAL | 0 refills | Status: AC | PRN
  Filled 2024-06-21: qty 30, 30d supply, fill #0

## 2024-06-21 MED ORDER — LIDOCAINE HCL (PF) 1 % IJ SOLN
INTRAMUSCULAR | Status: AC
  Filled 2024-06-21: qty 30

## 2024-06-21 MED ORDER — CLOPIDOGREL BISULFATE 75 MG PO TABS
75.0000 mg | ORAL_TABLET | Freq: Every day | ORAL | 0 refills | Status: AC
  Filled 2024-06-21: qty 30, 30d supply, fill #0

## 2024-06-21 MED ORDER — HEPARIN (PORCINE) IN NACL 1000-0.9 UT/500ML-% IV SOLN
INTRAVENOUS | Status: DC | PRN
  Administered 2024-06-21 (×2): 500 mL

## 2024-06-21 MED ORDER — LOSARTAN POTASSIUM 25 MG PO TABS: 12.5000 mg | ORAL_TABLET | Freq: Every day | ORAL | 11 refills | Status: AC

## 2024-06-21 MED ORDER — FENTANYL CITRATE (PF) 100 MCG/2ML IJ SOLN
INTRAMUSCULAR | Status: DC | PRN
  Administered 2024-06-21: 25 ug via INTRAVENOUS

## 2024-06-21 MED ORDER — FREE WATER: 500.0000 mL | Freq: Once | Status: DC

## 2024-06-21 MED ORDER — HYDRALAZINE HCL 20 MG/ML IJ SOLN: 10.0000 mg | INTRAMUSCULAR | Status: DC | PRN

## 2024-06-21 MED ORDER — SODIUM CHLORIDE 0.9% FLUSH: 3.0000 mL | INTRAVENOUS | Status: DC | PRN

## 2024-06-21 MED ORDER — IOHEXOL 350 MG/ML SOLN
INTRAVENOUS | Status: DC | PRN
  Administered 2024-06-21: 120 mL

## 2024-06-21 MED ORDER — ONDANSETRON HCL 4 MG/2ML IJ SOLN: 4.0000 mg | Freq: Four times a day (QID) | INTRAMUSCULAR | Status: DC | PRN

## 2024-06-21 MED ORDER — FENTANYL CITRATE (PF) 100 MCG/2ML IJ SOLN
INTRAMUSCULAR | Status: AC
  Filled 2024-06-21: qty 2

## 2024-06-21 MED ORDER — NITROGLYCERIN 1 MG/10 ML FOR IR/CATH LAB
INTRA_ARTERIAL | Status: AC
  Filled 2024-06-21: qty 10

## 2024-06-21 MED ORDER — CLOPIDOGREL BISULFATE 75 MG PO TABS: 75.0000 mg | ORAL_TABLET | Freq: Every day | ORAL | Status: DC

## 2024-06-21 MED ORDER — LABETALOL HCL 5 MG/ML IV SOLN: 10.0000 mg | INTRAVENOUS | Status: DC | PRN

## 2024-06-21 MED ORDER — ACETAMINOPHEN 325 MG PO TABS: 650.0000 mg | ORAL_TABLET | ORAL | Status: DC | PRN

## 2024-06-21 MED ORDER — MIDAZOLAM HCL 2 MG/2ML IJ SOLN
INTRAMUSCULAR | Status: AC
  Filled 2024-06-21: qty 2

## 2024-06-21 NOTE — Discharge Instructions (Signed)

## 2024-06-21 NOTE — Interval H&P Note (Signed)
 History and Physical Interval Note:  06/21/2024 8:42 AM  Cody Conway  has presented today for surgery, with the diagnosis of dyspnea on exertion, coronary artery disease, and abnormal stress test.  The various methods of treatment have been discussed with the patient and family. After consideration of risks, benefits and other options for treatment, the patient has consented to  Procedures: LEFT HEART CATH AND CORONARY ANGIOGRAPHY (N/A) as a surgical intervention.  The patient's history has been reviewed, patient examined, no change in status, stable for surgery.  I have reviewed the patient's chart and labs.  Questions were answered to the patient's satisfaction.    Cath Lab Visit (complete for each Cath Lab visit)  Clinical Evaluation Leading to the Procedure:   ACS: No.  Non-ACS:    Anginal Classification: CCS II  Anti-ischemic medical therapy: No Therapy  Non-Invasive Test Results: High-risk stress test findings: cardiac mortality >3%/year  Prior CABG: No previous CABG  [name]

## 2024-06-21 NOTE — Discharge Summary (Signed)
 "   Discharge Summary for Same Day PCI   Patient ID: Cody Conway MRN: [id number]; DOB: [birth year]  Admit date: 06/21/2024 Discharge date: 06/21/2024  Primary Care Provider: Candise Aleene DEL, MD  Primary Cardiologist: Redell Shallow, MD  Primary Electrophysiologist:  None   Discharge Diagnoses    Active Problems:   CAD (coronary artery disease)    Diagnostic Studies/Procedures    Cardiac Catheterization 06/21/2024:    In the absence of any other complications or medical issues, we expect the patient to be ready for discharge from an interventional cardiology perspective on 06/21/2024.   Recommend uninterrupted dual antiplatelet therapy with Aspirin  81mg  daily and Clopidogrel  75mg  daily for a minimum of 6 months (stable ischemic heart disease-Class I recommendation).   Conclusions: Severe single-vessel coronary artery disease with 95% proximal/mid LAD stenosis and TIMI II flow.  Mild, nonobstructive disease noted in the LCx and RCA. Moderately reduced left ventricular systolic function with anterior hypokinesis. Normal left ventricular filling pressure. Successful IVUS-guided PCI to proximal/mid LAD using Onyx frontier 2.75 x 26 mm drug-eluting stent (postdilated to 3.1 mm) with 0% residual stenosis and TIMI-3 flow.   Recommendations: Dual antiplatelet therapy with aspirin  and clopidogrel  for at least 6 months. Aggressive secondary prevention of coronary artery disease. Initiate losartan  12.5 mg daily in the setting of heart failure with mildly reduced ejection fraction.  Defer adding a beta-blocker at this time given borderline low resting heart rate. Anticipate same-day discharge if no post PCI complications occur during 6 hours of monitoring.   Cody Hanson, MD [facility] _____________   History of Present Illness     Cody Conway is a 54 y.o. male with PMH of CAD noted on CT. Patient was referred to cardiology recently following a chest CT for lung cancer screening  demonstrating advanced three-vessel coronary artery calcification, aortic atherosclerosis, and emphysema.  At initial visit with Dr. Shallow on June 04, 2024 patient did describe some DOE with vigorous activity that relieved with rest.  Nuclear med study on 05/28/2024 was a high risk study, consistent with ischemia, EF 46%, result findings can be seen below, no significant extracardiac findings.    Seen in the office on 1/23 to review results. Denied chest pains, palpitations, orthopnea, PND, dizziness, lightheadedness, near syncope, dark/tarry/bloody stools, hematuria, weight gain, edema.  He did report dyspnea on exertion, notably when traversing the 12 stairs at his house.  Reported his uncle had a history of a silent MI in the past.  Stated he stopped smoking in October.  He does have significant anxiety, for which he manages with Xanax.  Cardiac catheterization was arranged for further evaluation.  Hospital Course     The patient underwent cardiac cath as noted above with severe single vessel CAD of 95% p/mLAD, mild nonobstructive disease on the LCx, and RCA. Plan for DAPT with ASA/plavix  for at least 6 months. The patient was seen by cardiac rehab while in short stay. There were no observed complications post cath. Radial cath site was re-evaluated prior to discharge and found to be stable without any complications. Instructions/precautions regarding cath site care were given prior to discharge.  Cody Conway was seen by Dr. Hanson and determined stable for discharge home. Follow up with our office has been arranged. Medications are listed below. Pertinent changes include addition of plavix , losartan , protonix .  _____________  Cath/PCI Registry Performance & Quality Measures: Aspirin  prescribed? - Yes ADP Receptor Inhibitor (Plavix /Clopidogrel , Brilinta/Ticagrelor or Effient/Prasugrel) prescribed (includes medically managed patients)? - Yes  High Intensity Statin (Lipitor 40-80mg  or Crestor  20-40mg ) prescribed? - Yes For EF <40%, was ACEI/ARB prescribed? - Yes For EF <40%, Aldosterone Antagonist (Spironolactone or Eplerenone) prescribed? - No - Outpatient Echocardiogram to assess EF will be scheduled. Cardiac Rehab Phase II ordered (Included Medically managed Patients)? - Yes  _____________   Discharge Vitals Blood pressure 127/79, pulse (!) 59, temperature 97.6 F (36.4 C), temperature source Oral, resp. rate 14, height 5' 8 (1.727 m), weight 91.6 kg, SpO2 97%.  Filed Weights   06/21/24 0704  Weight: 91.6 kg    Last Labs & Radiologic Studies    CBC Recent Labs    06/21/24 0720  WBC 8.2  HGB 13.7  HCT 39.4  MCV 94.5  PLT 280   Basic Metabolic Panel Recent Labs    98/70/73 0720  NA 138  K 3.7  CL 101  CO2 28  GLUCOSE 117*  BUN 8  CREATININE 0.86  CALCIUM  8.7*   Liver Function Tests No results for input(s): AST, ALT, ALKPHOS, BILITOT, PROT, ALBUMIN in the last 72 hours. No results for input(s): LIPASE, AMYLASE in the last 72 hours. High Sensitivity Troponin:   No results for input(s): TROPONINIHS in the last 720 hours.  BNP Invalid input(s): POCBNP D-Dimer No results for input(s): DDIMER in the last 72 hours. Hemoglobin A1C No results for input(s): HGBA1C in the last 72 hours. Fasting Lipid Panel No results for input(s): CHOL, HDL, LDLCALC, TRIG, CHOLHDL, LDLDIRECT in the last 72 hours. Thyroid  Function Tests No results for input(s): TSH, T4TOTAL, T3FREE, THYROIDAB in the last 72 hours.  Invalid input(s): FREET3 _____________  CARDIAC CATHETERIZATION Result Date: 06/21/2024   In the absence of any other complications or medical issues, we expect the patient to be ready for discharge from an interventional cardiology perspective on 06/21/2024.   Recommend uninterrupted dual antiplatelet therapy with Aspirin  81mg  daily and Clopidogrel  75mg  daily for a minimum of 6 months (stable ischemic heart  disease-Class I recommendation). Conclusions: Severe single-vessel coronary artery disease with 95% proximal/mid LAD stenosis and TIMI II flow.  Mild, nonobstructive disease noted in the LCx and RCA. Moderately reduced left ventricular systolic function with anterior hypokinesis. Normal left ventricular filling pressure. Successful IVUS-guided PCI to proximal/mid LAD using Onyx frontier 2.75 x 26 mm drug-eluting stent (postdilated to 3.1 mm) with 0% residual stenosis and TIMI-3 flow. Recommendations: Dual antiplatelet therapy with aspirin  and clopidogrel  for at least 6 months. Aggressive secondary prevention of coronary artery disease. Initiate losartan  12.5 mg daily in the setting of heart failure with mildly reduced ejection fraction.  Defer adding a beta-blocker at this time given borderline low resting heart rate. Anticipate same-day discharge if no post PCI complications occur during 6 hours of monitoring. Cody Hanson, MD [facility]  NM PET CT CARDIAC PERFUSION MULTI W/ABSOLUTE BLOODFLOW Result Date: 06/13/2024   LV perfusion is abnormal. There is evidence of ischemia. There is no evidence of infarction. Defect 1: There is a large defect with severe reduction in uptake present in the apical to mid anterior, septal and apex location(s) that is reversible. There is abnormal wall motion in the defect area. Consistent with ischemia. The defect is consistent with abnormal perfusion in the LAD territory.   Rest left ventricular function is abnormal. Rest global function is mildly reduced. There was a single regional abnormality. Rest EF: 46%. Stress left ventricular function is abnormal. Stress global function is mildly reduced. There was a single regional abnormality. Stress EF: 46%. End diastolic cavity size  is mildly enlarged. End systolic cavity size is mildly enlarged.   Myocardial blood flow was computed to be 0.60ml/g/min at rest and 1.10ml/g/min at stress. Global myocardial blood flow reserve was  2.08 and was normal. Reserve abnormal in area of LAD ischemia at 1.22   Coronary calcium  was present on the attenuation correction CT images. Moderate coronary calcifications were present. Coronary calcifications were present in the left anterior descending artery distribution(s).   Findings are consistent with ischemia. The study is high risk.   Large area of ischemia in the LAD territory with abnormal localized MBFR 1.22 and visual TID CLINICAL DATA:  This over-read does not include interpretation of cardiac or coronary anatomy or pathology. The Cardiac PET CT interpretation by the cardiologist is attached. COMPARISON:  CT scan abdomen and pelvis from May 2021. FINDINGS: Mediastinum/Nodes: No solid / cystic mediastinal masses. The visualized esophagus is nondistended precluding optimal assessment. No thoracic lymphadenopathy by size criteria. Lungs/Pleura: There is mild, smooth, circumferential thickening of the visualized segmental and subsegmental bronchial walls, which is nonspecific. Findings are most commonly seen with bronchitis or reactive airway disease, such as asthma. There are dependent changes in bilateral lungs. No mass or consolidation. No pleural effusion or pneumothorax. There is a subpleural solid noncalcified 4 x 5 mm nodule in the right lung lower lobe (series 4, image 50), which is unchanged since the prior study from May 2021 and favored benign in etiology. No new or suspicious lung nodule. Upper Abdomen: Visualized upper abdominal viscera within normal limits. Musculoskeletal: The visualized soft tissues of the chest wall are grossly unremarkable. No suspicious osseous lesions. IMPRESSION: No acute or significant extracardiac findings. Electronically Signed   By: Ree Molt M.D.   On: 06/13/2024 09:13   Disposition   Pt is being discharged home today in good condition.  Follow-up Plans & Appointments     Discharge Instructions     Amb Referral to Cardiac Rehabilitation    Complete by: As directed    [facility]   Diagnosis: Coronary Stents   After initial evaluation and assessments completed: Virtual Based Care may be provided alone or in conjunction with Phase 2 Cardiac Rehab based on patient barriers.: Yes   Intensive Cardiac Rehabilitation (ICR) [facility] location only OR Traditional Cardiac Rehabilitation (TCR) *If criteria for ICR are not met will enroll in TCR [facility] only): Yes        Discharge Medications   Allergies as of 06/21/2024   No Known Allergies      Medication List     STOP taking these medications    esomeprazole  20 MG capsule Commonly known as: NEXIUM    ibuprofen  800 MG tablet Commonly known as: ADVIL        TAKE these medications    ALPRAZolam 1 MG tablet Commonly known as: XANAX Take 1 mg by mouth 3 (three) times daily as needed for anxiety.   amitriptyline  25 MG tablet Commonly known as: ELAVIL  Take 1 tablet (25 mg total) by mouth at bedtime.   aspirin  EC 81 MG tablet Take 1 tablet (81 mg total) by mouth daily. Swallow whole.   atorvastatin  80 MG tablet Commonly known as: LIPITOR Take 1 tablet (80 mg total) by mouth daily.   clopidogrel  75 MG tablet Commonly known as: Plavix  Take 1 tablet (75 mg total) by mouth daily.   losartan  25 MG tablet Commonly known as: Cozaar  Take 0.5 tablets (12.5 mg total) by mouth daily.   nitroGLYCERIN  0.4 MG SL tablet Commonly known as:  NITROSTAT  Place 1 tablet (0.4 mg total) under the tongue every 5 (five) minutes as needed for chest pain.   ondansetron  8 MG disintegrating tablet Commonly known as: ZOFRAN -ODT Take 1 tablet (8 mg total) by mouth every 8 (eight) hours as needed for nausea or vomiting.   pantoprazole  20 MG tablet Commonly known as: Protonix  Take 1 tablet (20 mg total) by mouth daily as needed for heartburn.   sildenafil  20 MG tablet Commonly known as: REVATIO  TAKE 1-5 TABS BY MOUTH DAILY AS NEEDED            Allergies Allergies[1]  Outstanding Labs/Studies     Duration of Discharge Encounter   Greater than 30 minutes including physician time.  Signed, Manuelita Rummer, NP 06/21/2024, 1:59 PM     [1] No Known Allergies  "

## 2024-06-21 NOTE — Progress Notes (Signed)
 CARDIAC REHAB PHASE I     Post stent education including site care, restrictions, risk factors, exercise guidelines, NTG use, antiplatelet therapy importance, heart healthy diet, and CRP2 reviewed. All questions and concerns addressed. Will refer to [facility]  for CRP2. Plan for home later today.    [id number] Isaiah JAYSON Liverpool, RN BSN 06/21/2024 11:50 AM

## 2024-06-21 NOTE — Progress Notes (Signed)
 TR band removed at 1315, gauze dressing applied. Right radial level 0, clean, dry, and intact.

## 2024-06-22 ENCOUNTER — Encounter (HOSPITAL_COMMUNITY): Payer: Self-pay | Admitting: Internal Medicine

## 2024-06-22 MED FILL — Lidocaine HCl Local Preservative Free (PF) Inj 1%: INTRAMUSCULAR | Qty: 30 | Status: AC

## 2024-07-03 ENCOUNTER — Ambulatory Visit: Admitting: Physician Assistant

## 2024-07-18 ENCOUNTER — Ambulatory Visit (HOSPITAL_BASED_OUTPATIENT_CLINIC_OR_DEPARTMENT_OTHER)
